# Patient Record
Sex: Male | Born: 1970 | Race: White | Hispanic: No | Marital: Single | Smoking: Current every day smoker
Health system: Southern US, Community
[De-identification: ages and names within clinical notes are randomized; demographics above are authoritative.]

## PROBLEM LIST (undated history)

## (undated) DIAGNOSIS — J439 Emphysema, unspecified: Secondary | ICD-10-CM

## (undated) DIAGNOSIS — F1910 Other psychoactive substance abuse, uncomplicated: Secondary | ICD-10-CM

## (undated) DIAGNOSIS — F1010 Alcohol abuse, uncomplicated: Secondary | ICD-10-CM

## (undated) DIAGNOSIS — C4491 Basal cell carcinoma of skin, unspecified: Secondary | ICD-10-CM

## (undated) DIAGNOSIS — F419 Anxiety disorder, unspecified: Secondary | ICD-10-CM

## (undated) DIAGNOSIS — J45909 Unspecified asthma, uncomplicated: Secondary | ICD-10-CM

## (undated) DIAGNOSIS — M1990 Unspecified osteoarthritis, unspecified site: Secondary | ICD-10-CM

## (undated) DIAGNOSIS — I10 Essential (primary) hypertension: Secondary | ICD-10-CM

## (undated) DIAGNOSIS — F32A Depression, unspecified: Secondary | ICD-10-CM

## (undated) HISTORY — DX: Basal cell carcinoma of skin, unspecified: C44.91

## (undated) HISTORY — PX: KNEE SURGERY: SHX244

## (undated) HISTORY — DX: Emphysema, unspecified: J43.9

## (undated) HISTORY — DX: Anxiety disorder, unspecified: F41.9

## (undated) HISTORY — DX: Depression, unspecified: F32.A

## (undated) HISTORY — DX: Other psychoactive substance abuse, uncomplicated: F19.10

## (undated) HISTORY — DX: Unspecified asthma, uncomplicated: J45.909

## (undated) HISTORY — DX: Unspecified osteoarthritis, unspecified site: M19.90

---

## 1998-05-20 ENCOUNTER — Emergency Department (HOSPITAL_COMMUNITY): Admission: EM | Admit: 1998-05-20 | Discharge: 1998-05-20 | Payer: Self-pay | Admitting: Emergency Medicine

## 1998-08-19 ENCOUNTER — Encounter: Payer: Self-pay | Admitting: Endocrinology

## 1998-08-19 ENCOUNTER — Emergency Department (HOSPITAL_COMMUNITY): Admission: EM | Admit: 1998-08-19 | Discharge: 1998-08-19 | Payer: Self-pay | Admitting: Endocrinology

## 1998-08-30 ENCOUNTER — Emergency Department (HOSPITAL_COMMUNITY): Admission: EM | Admit: 1998-08-30 | Discharge: 1998-08-30 | Payer: Self-pay | Admitting: Emergency Medicine

## 1999-02-14 ENCOUNTER — Emergency Department (HOSPITAL_COMMUNITY): Admission: EM | Admit: 1999-02-14 | Discharge: 1999-02-14 | Payer: Self-pay | Admitting: Emergency Medicine

## 1999-02-14 ENCOUNTER — Encounter: Payer: Self-pay | Admitting: Emergency Medicine

## 1999-11-20 ENCOUNTER — Encounter: Admission: RE | Admit: 1999-11-20 | Discharge: 1999-11-20 | Payer: Self-pay | Admitting: Internal Medicine

## 1999-12-25 ENCOUNTER — Encounter: Admission: RE | Admit: 1999-12-25 | Discharge: 1999-12-25 | Payer: Self-pay | Admitting: Internal Medicine

## 2000-02-25 ENCOUNTER — Encounter: Payer: Self-pay | Admitting: Emergency Medicine

## 2000-02-25 ENCOUNTER — Emergency Department (HOSPITAL_COMMUNITY): Admission: EM | Admit: 2000-02-25 | Discharge: 2000-02-25 | Payer: Self-pay | Admitting: Emergency Medicine

## 2000-03-04 ENCOUNTER — Emergency Department (HOSPITAL_COMMUNITY): Admission: EM | Admit: 2000-03-04 | Discharge: 2000-03-04 | Payer: Self-pay | Admitting: Emergency Medicine

## 2001-11-12 ENCOUNTER — Encounter: Admission: RE | Admit: 2001-11-12 | Discharge: 2001-11-12 | Payer: Self-pay | Admitting: Internal Medicine

## 2001-11-24 ENCOUNTER — Ambulatory Visit (HOSPITAL_COMMUNITY): Admission: RE | Admit: 2001-11-24 | Discharge: 2001-11-24 | Payer: Self-pay | Admitting: Internal Medicine

## 2001-11-24 ENCOUNTER — Encounter: Payer: Self-pay | Admitting: Internal Medicine

## 2002-05-10 ENCOUNTER — Encounter: Payer: Self-pay | Admitting: *Deleted

## 2002-05-10 ENCOUNTER — Emergency Department (HOSPITAL_COMMUNITY): Admission: EM | Admit: 2002-05-10 | Discharge: 2002-05-10 | Payer: Self-pay | Admitting: *Deleted

## 2003-07-15 ENCOUNTER — Emergency Department (HOSPITAL_COMMUNITY): Admission: EM | Admit: 2003-07-15 | Discharge: 2003-07-15 | Payer: Self-pay | Admitting: Emergency Medicine

## 2003-07-17 ENCOUNTER — Emergency Department (HOSPITAL_COMMUNITY): Admission: EM | Admit: 2003-07-17 | Discharge: 2003-07-17 | Payer: Self-pay | Admitting: Emergency Medicine

## 2006-12-23 ENCOUNTER — Emergency Department (HOSPITAL_COMMUNITY): Admission: EM | Admit: 2006-12-23 | Discharge: 2006-12-24 | Payer: Self-pay | Admitting: Emergency Medicine

## 2009-10-19 ENCOUNTER — Emergency Department (HOSPITAL_COMMUNITY): Admission: EM | Admit: 2009-10-19 | Discharge: 2009-10-20 | Payer: Self-pay | Admitting: Emergency Medicine

## 2010-07-23 LAB — DIFFERENTIAL
Basophils Relative: 2 % — ABNORMAL HIGH (ref 0–1)
Eosinophils Relative: 4 % (ref 0–5)
Lymphocytes Relative: 45 % (ref 12–46)
Monocytes Absolute: 0.8 10*3/uL (ref 0.1–1.0)
Monocytes Relative: 8 % (ref 3–12)
Neutro Abs: 3.9 10*3/uL (ref 1.7–7.7)

## 2010-07-23 LAB — CBC
HCT: 51.1 % (ref 39.0–52.0)
Platelets: 267 10*3/uL (ref 150–400)
RDW: 13.1 % (ref 11.5–15.5)
WBC: 9.5 10*3/uL (ref 4.0–10.5)

## 2010-07-23 LAB — COMPREHENSIVE METABOLIC PANEL
ALT: 22 U/L (ref 0–53)
AST: 25 U/L (ref 0–37)
Albumin: 3.8 g/dL (ref 3.5–5.2)
Alkaline Phosphatase: 169 U/L — ABNORMAL HIGH (ref 39–117)
BUN: 2 mg/dL — ABNORMAL LOW (ref 6–23)
CO2: 29 mEq/L (ref 19–32)
Calcium: 8.7 mg/dL (ref 8.4–10.5)
Chloride: 103 mEq/L (ref 96–112)
Creatinine, Ser: 0.91 mg/dL (ref 0.4–1.5)
GFR calc Af Amer: >60 mL/min (ref >60)
GFR calc non Af Amer: >60 mL/min (ref >60)
Glucose, Bld: 94 mg/dL (ref 70–99)
Potassium: 3.4 mEq/L — ABNORMAL LOW (ref 3.5–5.1)
Sodium: 139 mEq/L (ref 135–145)
Total Bilirubin: 0.5 mg/dL (ref 0.3–1.2)
Total Protein: 6.7 g/dL (ref 6.0–8.3)

## 2010-07-23 LAB — TRICYCLICS SCREEN, URINE: TCA Scrn: NOT DETECTED

## 2010-07-23 LAB — RAPID URINE DRUG SCREEN, HOSP PERFORMED
Amphetamines: NOT DETECTED
Benzodiazepines: NOT DETECTED
Tetrahydrocannabinol: NOT DETECTED

## 2010-07-23 LAB — ETHANOL: Alcohol, Ethyl (B): 320 mg/dL — ABNORMAL HIGH (ref 0–10)

## 2010-12-05 ENCOUNTER — Emergency Department (HOSPITAL_COMMUNITY)
Admission: EM | Admit: 2010-12-05 | Discharge: 2010-12-05 | Disposition: A | Discharge status: Home / Self Care | Payer: Self-pay | Attending: Emergency Medicine | Admitting: Emergency Medicine

## 2010-12-05 ENCOUNTER — Emergency Department (HOSPITAL_COMMUNITY): Payer: Self-pay

## 2010-12-05 DIAGNOSIS — M25569 Pain in unspecified knee: Secondary | ICD-10-CM | POA: Insufficient documentation

## 2010-12-05 DIAGNOSIS — X500XXA Overexertion from strenuous movement or load, initial encounter: Secondary | ICD-10-CM | POA: Insufficient documentation

## 2010-12-05 DIAGNOSIS — Y9373 Activity, racquet and hand sports: Secondary | ICD-10-CM | POA: Insufficient documentation

## 2010-12-05 DIAGNOSIS — IMO0002 Reserved for concepts with insufficient information to code with codable children: Secondary | ICD-10-CM | POA: Insufficient documentation

## 2010-12-18 ENCOUNTER — Ambulatory Visit (HOSPITAL_COMMUNITY)
Admission: RE | Admit: 2010-12-18 | Discharge: 2010-12-18 | Disposition: A | Discharge status: Home / Self Care | Payer: Self-pay | Source: Ambulatory Visit | Attending: Orthopedic Surgery | Admitting: Orthopedic Surgery

## 2010-12-18 ENCOUNTER — Encounter (HOSPITAL_COMMUNITY)
Admission: RE | Admit: 2010-12-18 | Discharge: 2010-12-18 | Disposition: A | Discharge status: Home / Self Care | Payer: Self-pay | Source: Ambulatory Visit | Attending: Orthopedic Surgery | Admitting: Orthopedic Surgery

## 2010-12-18 ENCOUNTER — Other Ambulatory Visit (HOSPITAL_COMMUNITY): Payer: Self-pay | Admitting: Orthopedic Surgery

## 2010-12-18 DIAGNOSIS — F17200 Nicotine dependence, unspecified, uncomplicated: Secondary | ICD-10-CM | POA: Insufficient documentation

## 2010-12-18 DIAGNOSIS — Z01812 Encounter for preprocedural laboratory examination: Secondary | ICD-10-CM | POA: Insufficient documentation

## 2010-12-18 DIAGNOSIS — S83242A Other tear of medial meniscus, current injury, left knee, initial encounter: Secondary | ICD-10-CM

## 2010-12-18 DIAGNOSIS — I10 Essential (primary) hypertension: Secondary | ICD-10-CM | POA: Insufficient documentation

## 2010-12-18 DIAGNOSIS — Z01818 Encounter for other preprocedural examination: Secondary | ICD-10-CM | POA: Insufficient documentation

## 2010-12-18 LAB — CBC
HCT: 43.5 % (ref 39.0–52.0)
Hemoglobin: 15.5 g/dL (ref 13.0–17.0)
MCH: 30.6 pg (ref 26.0–34.0)
MCHC: 35.6 g/dL (ref 30.0–36.0)
RDW: 13.2 % (ref 11.5–15.5)

## 2010-12-18 LAB — COMPREHENSIVE METABOLIC PANEL
Albumin: 3.6 g/dL (ref 3.5–5.2)
BUN: 8 mg/dL (ref 6–23)
Calcium: 9.6 mg/dL (ref 8.4–10.5)
Creatinine, Ser: 0.9 mg/dL (ref 0.50–1.35)
GFR calc Af Amer: >60 mL/min (ref >60)
Glucose, Bld: 102 mg/dL — ABNORMAL HIGH (ref 70–99)
Total Protein: 6.4 g/dL (ref 6.0–8.3)

## 2010-12-18 LAB — SURGICAL PCR SCREEN
MRSA, PCR: NEGATIVE
Staphylococcus aureus: NEGATIVE

## 2010-12-22 ENCOUNTER — Ambulatory Visit (HOSPITAL_COMMUNITY)
Admission: RE | Admit: 2010-12-22 | Discharge: 2010-12-22 | Disposition: A | Discharge status: Home / Self Care | Payer: Self-pay | Source: Ambulatory Visit | Attending: Orthopedic Surgery | Admitting: Orthopedic Surgery

## 2010-12-22 DIAGNOSIS — F17200 Nicotine dependence, unspecified, uncomplicated: Secondary | ICD-10-CM | POA: Insufficient documentation

## 2010-12-22 DIAGNOSIS — Z01812 Encounter for preprocedural laboratory examination: Secondary | ICD-10-CM | POA: Insufficient documentation

## 2010-12-22 DIAGNOSIS — Z01818 Encounter for other preprocedural examination: Secondary | ICD-10-CM | POA: Insufficient documentation

## 2010-12-22 DIAGNOSIS — Z01810 Encounter for preprocedural cardiovascular examination: Secondary | ICD-10-CM | POA: Insufficient documentation

## 2010-12-22 DIAGNOSIS — M23329 Other meniscus derangements, posterior horn of medial meniscus, unspecified knee: Secondary | ICD-10-CM | POA: Insufficient documentation

## 2010-12-22 DIAGNOSIS — M23205 Derangement of unspecified medial meniscus due to old tear or injury, unspecified knee: Secondary | ICD-10-CM | POA: Insufficient documentation

## 2010-12-22 DIAGNOSIS — M6750 Plica syndrome, unspecified knee: Secondary | ICD-10-CM | POA: Insufficient documentation

## 2011-01-11 NOTE — Op Note (Signed)
  Dwayne Cortez, Dwayne Cortez               ACCOUNT NO.:  [id number]  MEDICAL RECORD NO.:  [id number]  LOCATION:  SDSC                         FACILITY:  [facility]  PHYSICIAN:  Harvie Junior, M.D.   DATE OF BIRTH:  [birth year]  DATE OF PROCEDURE:  12/22/2010 DATE OF DISCHARGE:                              OPERATIVE REPORT   PREOPERATIVE NOTE:  Medial meniscal tear.  POSTOPERATIVE DIAGNOSES: 1. Medial meniscal tear. 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  PROCEDURE: 1. Partial posterior horn medial meniscectomy of bucket-handle medial     meniscal tear. 2. Debridement of chondromalacia patella. 3. Debridement of medial shelf plica.  SURGEON:  Harvie Junior, MD  ASSISTANT:  Cristine Polio, PA student.  ANESTHESIA:  General.  BRIEF HISTORY:  Dwayne Cortez is a 40 year old male with a history of having some knee pain who presented to the emergency room after a twisting episode with a locked knee.  We were consulted for his management and he was brought to the operating room for treatment of his locked knee.  PROCEDURE:  The patient was brought to the operating room.  After adequate anesthesia was obtained with general anesthetic, the patient was placed supine on the operating room table.  The left leg was then prepped and draped in usual sterile fashion.  Following this, a routine arthroscopic examination of the knee revealed there was an obvious bucket-handle medial meniscal tear.  This was released in the back, released in the front, removed in total.  The remaining meniscal rim was contoured down with a suction shaver.  Attention was then turned towards the ACL, which was normal, lateral side normal.  Patellofemoral joint showed some grade 2 chondromalacia, which was debrided with a suction shaver back to a smooth and stable rim.  There was large medial shelf plica draping entrance into the medial compartment was debrided to allow better access center of the medial  compartment.  At this point, the knee was copiously and thoroughly irrigated and suctioned dry, and all sterile portals were closed with bandage.  A sterile compressive dressing was applied and the patient was taken to recovery room and was noted to be in satisfactory condition.  Estimated blood loss procedure was none.     Harvie Junior, M.D.     Ranae Plumber  D:  12/22/2010  T:  12/22/2010  Job:  [id number]  Electronically Signed by Jodi Geralds M.D. on 01/11/2011 08:27:30 AM

## 2011-10-14 ENCOUNTER — Emergency Department (HOSPITAL_COMMUNITY)
Admission: EM | Admit: 2011-10-14 | Discharge: 2011-10-15 | Disposition: A | Discharge status: Rehabilitation Facility | Payer: Self-pay | Attending: Emergency Medicine | Admitting: Emergency Medicine

## 2011-10-14 ENCOUNTER — Encounter (HOSPITAL_COMMUNITY): Payer: Self-pay | Admitting: Emergency Medicine

## 2011-10-14 ENCOUNTER — Emergency Department (HOSPITAL_COMMUNITY)
Admission: EM | Admit: 2011-10-14 | Discharge: 2011-10-14 | Disposition: A | Discharge status: Home / Self Care | Payer: Self-pay

## 2011-10-14 DIAGNOSIS — F329 Major depressive disorder, single episode, unspecified: Secondary | ICD-10-CM | POA: Insufficient documentation

## 2011-10-14 DIAGNOSIS — R7401 Elevation of levels of liver transaminase levels: Secondary | ICD-10-CM | POA: Insufficient documentation

## 2011-10-14 DIAGNOSIS — R7402 Elevation of levels of lactic acid dehydrogenase (LDH): Secondary | ICD-10-CM | POA: Insufficient documentation

## 2011-10-14 DIAGNOSIS — I10 Essential (primary) hypertension: Secondary | ICD-10-CM | POA: Insufficient documentation

## 2011-10-14 DIAGNOSIS — F17200 Nicotine dependence, unspecified, uncomplicated: Secondary | ICD-10-CM | POA: Insufficient documentation

## 2011-10-14 DIAGNOSIS — F1410 Cocaine abuse, uncomplicated: Secondary | ICD-10-CM | POA: Insufficient documentation

## 2011-10-14 DIAGNOSIS — F1010 Alcohol abuse, uncomplicated: Secondary | ICD-10-CM | POA: Insufficient documentation

## 2011-10-14 DIAGNOSIS — F3289 Other specified depressive episodes: Secondary | ICD-10-CM | POA: Insufficient documentation

## 2011-10-14 HISTORY — DX: Essential (primary) hypertension: I10

## 2011-10-14 LAB — CBC
MCHC: 36.5 g/dL — ABNORMAL HIGH (ref 30.0–36.0)
Platelets: 235 10*3/uL (ref 150–400)
RDW: 13.3 % (ref 11.5–15.5)
WBC: 9.2 10*3/uL (ref 4.0–10.5)

## 2011-10-14 LAB — RAPID URINE DRUG SCREEN, HOSP PERFORMED
Amphetamines: NOT DETECTED
Barbiturates: NOT DETECTED
Benzodiazepines: NOT DETECTED
Cocaine: POSITIVE — AB

## 2011-10-14 LAB — DIFFERENTIAL
Basophils Absolute: 0.1 10*3/uL (ref 0.0–0.1)
Basophils Relative: 2 % — ABNORMAL HIGH (ref 0–1)
Lymphocytes Relative: 32 % (ref 12–46)
Monocytes Absolute: 0.9 10*3/uL (ref 0.1–1.0)
Neutro Abs: 4.8 10*3/uL (ref 1.7–7.7)
Neutrophils Relative %: 53 % (ref 43–77)

## 2011-10-14 LAB — COMPREHENSIVE METABOLIC PANEL
ALT: 437 U/L — ABNORMAL HIGH (ref 0–53)
AST: 514 U/L — ABNORMAL HIGH (ref 0–37)
Albumin: 3.8 g/dL (ref 3.5–5.2)
Chloride: 98 mEq/L (ref 96–112)
Creatinine, Ser: 0.8 mg/dL (ref 0.50–1.35)
Sodium: 137 mEq/L (ref 135–145)
Total Bilirubin: 0.4 mg/dL (ref 0.3–1.2)

## 2011-10-14 LAB — ACETAMINOPHEN LEVEL: Acetaminophen (Tylenol), Serum: <15 ug/mL (ref 10–30)

## 2011-10-14 LAB — SALICYLATE LEVEL: Salicylate Lvl: <2 mg/dL — ABNORMAL LOW (ref 2.8–20.0)

## 2011-10-14 MED ORDER — ONDANSETRON HCL 8 MG PO TABS: 4.0000 mg | ORAL_TABLET | Freq: Three times a day (TID) | ORAL | Status: DC | PRN

## 2011-10-14 MED ORDER — NICOTINE 21 MG/24HR TD PT24
21.0000 mg | MEDICATED_PATCH | Freq: Every day | TRANSDERMAL | Status: DC
  Administered 2011-10-14: 21 mg via TRANSDERMAL
  Filled 2011-10-14: qty 1

## 2011-10-14 MED ORDER — LORAZEPAM 1 MG PO TABS
1.0000 mg | ORAL_TABLET | Freq: Four times a day (QID) | ORAL | Status: DC | PRN
  Administered 2011-10-14 – 2011-10-15 (×2): 1 mg via ORAL
  Filled 2011-10-14: qty 1

## 2011-10-14 MED ORDER — VITAMIN B-1 100 MG PO TABS
100.0000 mg | ORAL_TABLET | Freq: Every day | ORAL | Status: DC
  Administered 2011-10-14: 100 mg via ORAL
  Filled 2011-10-14: qty 1

## 2011-10-14 MED ORDER — LORAZEPAM 2 MG/ML IJ SOLN: 1.0000 mg | Freq: Four times a day (QID) | INTRAMUSCULAR | Status: DC | PRN

## 2011-10-14 MED ORDER — LORAZEPAM 1 MG PO TABS
0.0000 mg | ORAL_TABLET | Freq: Four times a day (QID) | ORAL | Status: DC
  Administered 2011-10-14 (×2): 1 mg via ORAL
  Filled 2011-10-14: qty 1
  Filled 2011-10-14: qty 2
  Filled 2011-10-14: qty 1

## 2011-10-14 MED ORDER — FOLIC ACID 1 MG PO TABS
1.0000 mg | ORAL_TABLET | Freq: Every day | ORAL | Status: DC
  Administered 2011-10-14: 1 mg via ORAL
  Filled 2011-10-14: qty 1

## 2011-10-14 MED ORDER — LORAZEPAM 1 MG PO TABS: 0.0000 mg | ORAL_TABLET | Freq: Two times a day (BID) | ORAL | Status: DC

## 2011-10-14 MED ORDER — LORAZEPAM 1 MG PO TABS: 1.0000 mg | ORAL_TABLET | Freq: Three times a day (TID) | ORAL | Status: DC | PRN

## 2011-10-14 MED ORDER — THIAMINE HCL 100 MG/ML IJ SOLN: 100.0000 mg | Freq: Every day | INTRAMUSCULAR | Status: DC

## 2011-10-14 MED ORDER — ADULT MULTIVITAMIN W/MINERALS CH
1.0000 | ORAL_TABLET | Freq: Every day | ORAL | Status: DC
  Administered 2011-10-14: 1 via ORAL
  Filled 2011-10-14 (×2): qty 1

## 2011-10-14 NOTE — ED Notes (Signed)
Pt reports here for Detox. Pt had 2 drinks prior to arrival "to stop the tremors."

## 2011-10-14 NOTE — ED Notes (Signed)
After consulting ACT team member, pt. Moved to [facility] so he can have a sitter at bedside.
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
## 2011-10-14 NOTE — ED Notes (Signed)
Pt. Stated that he went to [facility] this morning for Detox but "couldn't take it" so he left. States he has tried detox in the past, last time 2 years ago. Reports his last drink being on his way here, had two [employer]ikes hard lemonades. Pt. Reports he also took 2 ativan this AM to stop tremors with no relief. Denies wanting to hurt himself or others. Friend at bedside.

## 2011-10-14 NOTE — ED Notes (Signed)
Meal tray ordered 

## 2011-10-14 NOTE — ED Provider Notes (Signed)
History     CSN: [id number]  Arrival date & time 10/14/11  1459   First MD Initiated Contact with Patient 10/14/11 1534      Chief Complaint  Patient presents with  . Medical Clearance    Detox     Patient is a 41 y.o. male presenting with drug/alcohol assessment. The history is provided by the patient.  Drug / Alcohol Assessment Primary symptoms include intoxication (pt states he drank 12-18 beers last night; 2 today).  Primary symptoms include no somnolence, no loss of consciousness, no seizures, no weakness, no agitation, no hallucinations and no self-injury. Primary symptoms comment: pt feels he "has the shakes" and "wants detox" This is a chronic problem. The current episode started more than 1 week ago. The problem has not changed since onset.Suspected agents include alcohol, cocaine and opiates. Pertinent negatives include no fever, no injury, no nausea and no vomiting. Associated medical issues include addiction treatment (pt was at detoxification program at [facility] approx 2 yrs ago) and withdrawal syndrome (pt believes he is withdrawing from alcohol; however, last drink just PTA and 12-18 beers last night).    Past Medical History  Diagnosis Date  . Hypertension     Past Surgical History  Procedure Date  . Knee surgery     History reviewed. No pertinent family history.  History  Substance Use Topics  . Smoking status: Current Everyday Smoker -- 1.5 packs/day  . Smokeless tobacco: Not on file  . Alcohol Use: Yes     "Drinks as much as I can get my hands on." 10-12 drinks a day      Review of Systems  Constitutional: Negative for fever and chills.  Respiratory: Negative for cough, chest tightness, shortness of breath and wheezing.   Cardiovascular: Negative for chest pain, palpitations and leg swelling.  Gastrointestinal: Negative for nausea, vomiting, abdominal pain, diarrhea and constipation.  Skin: Negative for rash and wound.  Neurological: Positive for tremors.  Negative for dizziness, seizures, loss of consciousness, syncope, weakness and light-headedness.  Psychiatric/Behavioral: Positive for decreased concentration. Negative for suicidal ideas, hallucinations, self-injury and agitation. The patient is nervous/anxious and is hyperactive.   All other systems reviewed and are negative.    Allergies  Review of patient's allergies indicates no known allergies.  Home Medications  No current outpatient prescriptions on file.  BP 147/92  Pulse 101  Temp(Src) 98.4 F (36.9 C) (Oral)  Resp 18  SpO2 97%  Physical Exam  Nursing note and vitals reviewed. Constitutional: He is oriented to person, place, and time. He appears well-developed and well-nourished.       Disheveled  HENT:  Head: Normocephalic and atraumatic.  Right Ear: External ear normal.  Left Ear: External ear normal.  Nose: Nose normal.  Mouth/Throat: Oropharynx is clear and moist. No oropharyngeal exudate.  Eyes: Conjunctivae and EOM are normal. Pupils are equal, round, and reactive to light.  Neck: Normal range of motion. Neck supple.  Cardiovascular: Normal rate, regular rhythm, normal heart sounds and intact distal pulses.   Pulmonary/Chest: Effort normal and breath sounds normal. No respiratory distress. He has no wheezes. He has no rales. He exhibits no tenderness.  Abdominal: Soft. Bowel sounds are normal. He exhibits no distension and no mass. There is no tenderness. There is no rebound and no guarding.  Musculoskeletal: Normal range of motion. He exhibits no edema and no tenderness.  Neurological: He is alert and oriented to person, place, and time.  Skin: Skin is warm and dry.  No rash noted. No erythema. No pallor.  Psychiatric:       Constricted affect    ED Course  Procedures (including critical care time)  Labs Reviewed  CBC - Abnormal; Notable for the following:    Hemoglobin 17.5 (*)    MCHC 36.5 (*)    All other components within normal limits    DIFFERENTIAL - Abnormal; Notable for the following:    Basophils Relative 2 (*)    All other components within normal limits  COMPREHENSIVE METABOLIC PANEL - Abnormal; Notable for the following:    Glucose, Bld 124 (*)    AST 514 (*) SLIGHT HEMOLYSIS   ALT 437 (*)    Alkaline Phosphatase 256 (*)    All other components within normal limits  ETHANOL - Abnormal; Notable for the following:    Alcohol, Ethyl (B) 259 (*)    All other components within normal limits  SALICYLATE LEVEL - Abnormal; Notable for the following:    Salicylate Lvl <2.0 (*)    All other components within normal limits  URINE RAPID DRUG SCREEN (HOSP PERFORMED) - Abnormal; Notable for the following:    Cocaine POSITIVE (*)    All other components within normal limits  ACETAMINOPHEN LEVEL   No results found.   1. Depression   2. Alcohol abuse   3. Cocaine abuse   4. Elevated transaminase level       MDM  41 yo M presents requesting detoxification from multiple substances, particularly alcohol. Not clinically intoxicated; however, pt states he drank 12-18 beers last night and had two beers today just PTA. Pt slightly tachycardic in triage (101 bpm); however, does not appear to be actively withdrawing. Psych panel ordered and c/w elevated transaminases; however, clinical picture not c/w acute hepatitis or  obstructive pathology; likely secondary to alcohol abuse. Pt also admits to depression but denies suicidal ideation. ACT team consulted and will evaluate in ED.        Clemetine Marker, MD 10/14/11 2243

## 2011-10-14 NOTE — ED Notes (Signed)
Pt. Sleeping in bed.

## 2011-10-15 ENCOUNTER — Inpatient Hospital Stay (HOSPITAL_COMMUNITY)
Admission: AD | Admit: 2011-10-15 | Discharge: 2011-10-17 | DRG: 885 | Disposition: A | Discharge status: Home / Self Care | Payer: Federal, State, Local not specified - Other | Source: Other Acute Inpatient Hospital | Attending: Psychiatry | Admitting: Psychiatry

## 2011-10-15 DIAGNOSIS — F1210 Cannabis abuse, uncomplicated: Secondary | ICD-10-CM | POA: Diagnosis present

## 2011-10-15 DIAGNOSIS — F1994 Other psychoactive substance use, unspecified with psychoactive substance-induced mood disorder: Secondary | ICD-10-CM

## 2011-10-15 DIAGNOSIS — F332 Major depressive disorder, recurrent severe without psychotic features: Principal | ICD-10-CM | POA: Diagnosis present

## 2011-10-15 DIAGNOSIS — F1410 Cocaine abuse, uncomplicated: Secondary | ICD-10-CM | POA: Diagnosis present

## 2011-10-15 DIAGNOSIS — F4010 Social phobia, unspecified: Secondary | ICD-10-CM | POA: Diagnosis present

## 2011-10-15 DIAGNOSIS — F10988 Alcohol use, unspecified with other alcohol-induced disorder: Secondary | ICD-10-CM | POA: Diagnosis present

## 2011-10-15 DIAGNOSIS — F17200 Nicotine dependence, unspecified, uncomplicated: Secondary | ICD-10-CM | POA: Diagnosis present

## 2011-10-15 DIAGNOSIS — F1010 Alcohol abuse, uncomplicated: Secondary | ICD-10-CM

## 2011-10-15 DIAGNOSIS — F1020 Alcohol dependence, uncomplicated: Secondary | ICD-10-CM | POA: Diagnosis present

## 2011-10-15 DIAGNOSIS — F411 Generalized anxiety disorder: Secondary | ICD-10-CM | POA: Diagnosis present

## 2011-10-15 DIAGNOSIS — I10 Essential (primary) hypertension: Secondary | ICD-10-CM | POA: Diagnosis present

## 2011-10-15 DIAGNOSIS — Z79899 Other long term (current) drug therapy: Secondary | ICD-10-CM

## 2011-10-15 MED ORDER — CARBAMAZEPINE 200 MG PO TABS
200.0000 mg | ORAL_TABLET | Freq: Three times a day (TID) | ORAL | Status: DC
  Administered 2011-10-15 – 2011-10-17 (×6): 200 mg via ORAL
  Filled 2011-10-15 (×10): qty 1

## 2011-10-15 MED ORDER — TRAZODONE HCL 50 MG PO TABS
50.0000 mg | ORAL_TABLET | Freq: Every evening | ORAL | Status: DC | PRN
  Administered 2011-10-15: 50 mg via ORAL
  Filled 2011-10-15: qty 1

## 2011-10-15 MED ORDER — CHLORDIAZEPOXIDE HCL 25 MG PO CAPS
25.0000 mg | ORAL_CAPSULE | Freq: Four times a day (QID) | ORAL | Status: DC | PRN
  Administered 2011-10-15: 25 mg via ORAL
  Filled 2011-10-15: qty 1

## 2011-10-15 MED ORDER — HYDROXYZINE HCL 25 MG PO TABS
25.0000 mg | ORAL_TABLET | Freq: Four times a day (QID) | ORAL | Status: DC | PRN
  Administered 2011-10-15: 25 mg via ORAL

## 2011-10-15 MED ORDER — CLONIDINE HCL 0.2 MG PO TABS
ORAL_TABLET | ORAL | Status: AC
  Filled 2011-10-15: qty 1

## 2011-10-15 MED ORDER — ALUM & MAG HYDROXIDE-SIMETH 200-200-20 MG/5ML PO SUSP: 30.0000 mL | ORAL | Status: DC | PRN

## 2011-10-15 MED ORDER — LOPERAMIDE HCL 2 MG PO CAPS: 2.0000 mg | ORAL_CAPSULE | ORAL | Status: DC | PRN

## 2011-10-15 MED ORDER — VITAMIN B-1 100 MG PO TABS
100.0000 mg | ORAL_TABLET | Freq: Every day | ORAL | Status: DC
  Administered 2011-10-16 – 2011-10-17 (×2): 100 mg via ORAL
  Filled 2011-10-15 (×2): qty 1
  Filled 2011-10-15: qty 14
  Filled 2011-10-15: qty 1

## 2011-10-15 MED ORDER — CLONIDINE HCL 0.1 MG PO TABS
0.2000 mg | ORAL_TABLET | Freq: Once | ORAL | Status: AC
  Administered 2011-10-15: 0.2 mg via ORAL

## 2011-10-15 MED ORDER — ONDANSETRON 4 MG PO TBDP: 4.0000 mg | ORAL_TABLET | Freq: Four times a day (QID) | ORAL | Status: DC | PRN

## 2011-10-15 MED ORDER — NICOTINE 21 MG/24HR TD PT24
21.0000 mg | MEDICATED_PATCH | Freq: Every day | TRANSDERMAL | Status: DC
  Administered 2011-10-15 – 2011-10-17 (×3): 21 mg via TRANSDERMAL
  Filled 2011-10-15: qty 1
  Filled 2011-10-15: qty 14
  Filled 2011-10-15 (×3): qty 1

## 2011-10-15 MED ORDER — MAGNESIUM HYDROXIDE 400 MG/5ML PO SUSP: 30.0000 mL | Freq: Every day | ORAL | Status: DC | PRN

## 2011-10-15 MED ORDER — LORAZEPAM 1 MG PO TABS
6.0000 mg | ORAL_TABLET | Freq: Once | ORAL | Status: AC
  Administered 2011-10-15: 6 mg via ORAL
  Filled 2011-10-15: qty 6

## 2011-10-15 MED ORDER — ADULT MULTIVITAMIN W/MINERALS CH
1.0000 | ORAL_TABLET | Freq: Every day | ORAL | Status: DC
  Administered 2011-10-15 – 2011-10-17 (×3): 1 via ORAL
  Filled 2011-10-15 (×5): qty 1

## 2011-10-15 MED ORDER — THIAMINE HCL 100 MG/ML IJ SOLN: 100.0000 mg | Freq: Once | INTRAMUSCULAR | Status: DC

## 2011-10-15 MED ORDER — ACETAMINOPHEN 325 MG PO TABS: 650.0000 mg | ORAL_TABLET | Freq: Four times a day (QID) | ORAL | Status: DC | PRN

## 2011-10-15 NOTE — Progress Notes (Signed)
[facility] MD Progress Note  10/15/2011 11:20 PM  Diagnosis:   Axis I: Alcohol Abuse and Substance Induced Mood Disorder Axis III:  Past Medical History  Diagnosis Date  . Hypertension     ADL's:  Intact  Sleep: Poor  Appetite:  Poor  Suicidal Ideation:  Pt is wanting to leave the hospital because he is anxious and does not like being around too many people.  He has depression and anxiety problems.  He is detoxing from alcohol.  Will use Tegretol for his alcohol detox. Homicidal Ideation:  Denies adamantly any homicidal thoughts.  Mental Status Examination/Evaluation: Objective:  Appearance: Disheveled  Eye Contact::  Fair  Speech:  Normal Rate  Volume:  Normal  Mood:  Anxious, Depressed, Hopeless and Irritable  Affect:  Blunt  Orientation:  Full  Thought Content:  WDL  Suicidal Thoughts:  No  Homicidal Thoughts:  No  Memory:  Immediate;   Fair  Judgement:  Impaired  Insight:  Lacking  Psychomotor Activity:  Normal  Concentration:  Fair  Recall:  Fair  Akathisia:  No  AIMS (if indicated):     Assets:  Communication Skills  Sleep:  Number of Hours: 0    Vital Signs:Blood pressure 155/110, pulse 101, temperature 98.1 F (36.7 C), temperature source Oral, resp. rate 17, height 6\' 1"  (1.854 m), weight 72.122 kg (159 lb). Current Medications: Current Facility-Administered Medications  Medication Dose Route Frequency Provider Last Rate Last Dose  . acetaminophen (TYLENOL) tablet 650 mg  650 mg Oral Q6H PRN Jorje Guild, PA-C      . alum & mag hydroxide-simeth (MAALOX/MYLANTA) 200-200-20 MG/5ML suspension 30 mL  30 mL Oral Q4H PRN Jorje Guild, PA-C      . carbamazepine (TEGRETOL) tablet 200 mg  200 mg Oral TID Mike Craze, MD   200 mg at 10/15/11 1721  . chlordiazePOXIDE (LIBRIUM) capsule 25 mg  25 mg Oral Q6H PRN Jorje Guild, PA-C   25 mg at 10/15/11 1118  . hydrOXYzine (ATARAX/VISTARIL) tablet 25 mg  25 mg Oral Q6H PRN Jorje Guild, PA-C   25 mg at 10/15/11 1118  . loperamide  (IMODIUM) capsule 2-4 mg  2-4 mg Oral PRN Jorje Guild, PA-C      . magnesium hydroxide (MILK OF MAGNESIA) suspension 30 mL  30 mL Oral Daily PRN Jorje Guild, PA-C      . multivitamin with minerals tablet 1 tablet  1 tablet Oral Daily Jorje Guild, PA-C   1 tablet at 10/15/11 [phone]  . nicotine (NICODERM CQ - dosed in mg/24 hours) patch 21 mg  21 mg Transdermal Daily Sanjuana Kava, NP   21 mg at 10/15/11 1721  . ondansetron (ZOFRAN-ODT) disintegrating tablet 4 mg  4 mg Oral Q6H PRN Jorje Guild, PA-C      . thiamine (B-1) injection 100 mg  100 mg Intramuscular Once Jorje Guild, PA-C      . thiamine (VITAMIN B-1) tablet 100 mg  100 mg Oral Daily Jorje Guild, PA-C      . traZODone (DESYREL) tablet 50 mg  50 mg Oral QHS PRN Jorje Guild, PA-C   50 mg at 10/15/11 2231   Facility-Administered Medications Ordered in Other Encounters  Medication Dose Route Frequency Provider Last Rate Last Dose  . cloNIDine (CATAPRES) tablet 0.2 mg  0.2 mg Oral Once Hilario Quarry, MD   0.2 mg at 10/15/11 0355  . LORazepam (ATIVAN) tablet 6 mg  6 mg Oral Once Hilario Quarry, MD  6 mg at 10/15/11 0335  . DISCONTD: folic acid (FOLVITE) tablet 1 mg  1 mg Oral Daily Clemetine Marker, MD   1 mg at 10/14/11 1631  . DISCONTD: LORazepam (ATIVAN) injection 1 mg  1 mg Intravenous Q6H PRN Clemetine Marker, MD      . DISCONTD: LORazepam (ATIVAN) tablet 0-4 mg  0-4 mg Oral Q6H Clemetine Marker, MD   1 mg at 10/14/11 2241  . DISCONTD: LORazepam (ATIVAN) tablet 0-4 mg  0-4 mg Oral Q12H Clemetine Marker, MD      . DISCONTD: LORazepam (ATIVAN) tablet 1 mg  1 mg Oral Q6H PRN Clemetine Marker, MD   1 mg at 10/15/11 0235  . DISCONTD: multivitamin with minerals tablet 1 tablet  1 tablet Oral Daily Clemetine Marker, MD   1 tablet at 10/14/11 1631  . DISCONTD: nicotine (NICODERM CQ - dosed in mg/24 hours) patch 21 mg  21 mg Transdermal Daily Clemetine Marker, MD   21 mg at 10/14/11 1633  . DISCONTD: ondansetron (ZOFRAN) tablet 4 mg  4 mg Oral Q8H PRN Clemetine Marker, MD      . DISCONTD: thiamine  (B-1) injection 100 mg  100 mg Intravenous Daily Clemetine Marker, MD      . DISCONTD: thiamine (VITAMIN B-1) tablet 100 mg  100 mg Oral Daily Clemetine Marker, MD   100 mg at 10/14/11 1631    Lab Results:  Results for orders placed during the hospital encounter of 10/14/11 (from the past 48 hour(s))  CBC     Status: Abnormal   Collection Time   10/14/11  3:53 PM      Component Value Range Comment   WBC 9.2  4.0 - 10.5 (K/uL)    RBC 5.19  4.22 - 5.81 (MIL/uL)    Hemoglobin 17.5 (*) 13.0 - 17.0 (g/dL)    HCT 16.1  09.6 - 04.5 (%)    MCV 92.3  78.0 - 100.0 (fL)    MCH 33.7  26.0 - 34.0 (pg)    MCHC 36.5 (*) 30.0 - 36.0 (g/dL)    RDW 40.9  81.1 - 91.4 (%)    Platelets 235  150 - 400 (K/uL)   DIFFERENTIAL     Status: Abnormal   Collection Time   10/14/11  3:53 PM      Component Value Range Comment   Neutrophils Relative 53  43 - 77 (%)    Neutro Abs 4.8  1.7 - 7.7 (K/uL)    Lymphocytes Relative 32  12 - 46 (%)    Lymphs Abs 3.0  0.7 - 4.0 (K/uL)    Monocytes Relative 10  3 - 12 (%)    Monocytes Absolute 0.9  0.1 - 1.0 (K/uL)    Eosinophils Relative 3  0 - 5 (%)    Eosinophils Absolute 0.3  0.0 - 0.7 (K/uL)    Basophils Relative 2 (*) 0 - 1 (%)    Basophils Absolute 0.1  0.0 - 0.1 (K/uL)   COMPREHENSIVE METABOLIC PANEL     Status: Abnormal   Collection Time   10/14/11  3:53 PM      Component Value Range Comment   Sodium 137  135 - 145 (mEq/L)    Potassium 3.9  3.5 - 5.1 (mEq/L)    Chloride 98  96 - 112 (mEq/L)    CO2 23  19 - 32 (mEq/L)    Glucose, Bld 124 (*) 70 - 99 (mg/dL)    BUN 6  6 - 23 (mg/dL)  Creatinine, Ser 0.80  0.50 - 1.35 (mg/dL)    Calcium 8.7  8.4 - 10.5 (mg/dL)    Total Protein 7.3  6.0 - 8.3 (g/dL)    Albumin 3.8  3.5 - 5.2 (g/dL)    AST 161 (*) 0 - 37 (U/L) SLIGHT HEMOLYSIS   ALT 437 (*) 0 - 53 (U/L)    Alkaline Phosphatase 256 (*) 39 - 117 (U/L)    Total Bilirubin 0.4  0.3 - 1.2 (mg/dL)    GFR calc non Af Amer >90  >90 (mL/min)    GFR calc Af Amer >90  >90 (mL/min)    ETHANOL     Status: Abnormal   Collection Time   10/14/11  3:53 PM      Component Value Range Comment   Alcohol, Ethyl (B) 259 (*) 0 - 11 (mg/dL)   ACETAMINOPHEN LEVEL     Status: Normal   Collection Time   10/14/11  3:53 PM      Component Value Range Comment   Acetaminophen (Tylenol), Serum <15.0  10 - 30 (ug/mL)   SALICYLATE LEVEL     Status: Abnormal   Collection Time   10/14/11  3:53 PM      Component Value Range Comment   Salicylate Lvl <2.0 (*) 2.8 - 20.0 (mg/dL)   URINE RAPID DRUG SCREEN (HOSP PERFORMED)     Status: Abnormal   Collection Time   10/14/11  5:20 PM      Component Value Range Comment   Opiates NONE DETECTED  NONE DETECTED     Cocaine POSITIVE (*) NONE DETECTED     Benzodiazepines NONE DETECTED  NONE DETECTED     Amphetamines NONE DETECTED  NONE DETECTED     Tetrahydrocannabinol NONE DETECTED  NONE DETECTED     Barbiturates NONE DETECTED  NONE DETECTED      Physical Findings: AIMS:  , ,  ,  ,    CIWA:  CIWA-Ar Total: 2  COWS:     Treatment Plan Summary: Daily contact with patient to assess and evaluate symptoms and progress in treatment Medication management No signs/symptoms of withdrawal and mood/anxiety less than 3/10 where 1 is the best and 10 is the worst.  Plan: Admit.  Detox with Tegretol to prevent withdrawal seizures, anxiety, and depression. Pt uncooperative with agreeing to consent to treat because of his addiction to alcohol.  Will detox and then re evaluate his motivation to seek further rehab treatment.  Damary Doland 10/15/2011, 11:20 PM

## 2011-10-15 NOTE — ED Provider Notes (Signed)
I saw and evaluated the patient, reviewed the resident's note and I agree with the findings and plan.  Requesting alcohol detox. Slight tachycardia but calm. Does not appear to be actively withdrawing. CIWA ordered.  Glynn Octave, MD 10/15/11 [phone]

## 2011-10-15 NOTE — H&P (Signed)
Medical/psychiatric screening examination/treatment/procedure(s) were performed by non-physician practitioner and as supervising physician I was immediately available for consultation/collaboration.  I have seen and examined this patient and agree the major elements of this evaluation.  

## 2011-10-15 NOTE — Progress Notes (Signed)
Patient seen during during d/c planning group and or treatment team.  Patient advised he wanted to discharge today.  He stated he felt caged up.  Patient rated depression at eight and anxiety at nine.  He denies SI/HI.  Patient was upset when advised by MD that he would not be discharged today.

## 2011-10-15 NOTE — Progress Notes (Signed)
patient upset today b/c of not being able to go home today, really upset w/MD and staff balling up his fists and jerking his arms up and down. Talked to him for a few minutes and offered him vistaril and Librium for anxiety, he accepted them and said "thank you". Denies Si or Hi. Eating meals in the DR, taking meds as ordered, was seen by the tx team and ordered Tegretol, this was begun at 12noon today. q20min safety checks continue and support offered Safety maintained
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
## 2011-10-15 NOTE — Progress Notes (Signed)
[facility] Group Notes:  (Counselor/Nursing/MHT/Case Management/Adjunct) 10/15/2011  11:00am Overcoming Obstacles to Wellness   Type of Therapy:  Group Therapy  Participation Level:  Did Not Attend     Josephine Rudnick Taree Hayes 10/15/2011   3:55 PM         [facility] Group Notes:  (Counselor/Nursing/MHT/Case Management/Adjunct) 10/15/2011  1:15pm Breathing & Meditating for Anxiety &Anger   Type of Therapy:  Group Therapy  Participation Level:  Did Not Attend     Herta Hink Taree Hayes 10/15/2011   3:55 PM                

## 2011-10-15 NOTE — Tx Team (Signed)
Initial Interdisciplinary Treatment Plan  PATIENT STRENGTHS: (choose at least two) Average or above average intelligence General fund of knowledge  PATIENT STRESSORS: Lose of job Financial difficulties    PROBLEM LIST: Problem List/Patient Goals Date to be addressed Date deferred Reason deferred Estimated date of resolution  Depressed with suicidal ideation 10/15/11     Substance Abuse 10/15/11                                                DISCHARGE CRITERIA:  Ability to meet basic life and health needs Adequate post-discharge living arrangements Improved stabilization in mood, thinking, and/or behavior Reduction of life-threatening or endangering symptoms to within safe limits Verbal commitment to aftercare and medication compliance Withdrawal symptoms are absent or subacute and managed without 24-hour nursing intervention  PRELIMINARY DISCHARGE PLAN: Attend 12-step recovery group Outpatient therapy Placement in alternative living arrangements  PATIENT/FAMIILY INVOLVEMENT: This treatment plan has been presented to and reviewed with the patient, Dwayne Cortez, Dwayne Cortez 10/15/2011, 5:38 AM

## 2011-10-15 NOTE — Tx Team (Addendum)
Interdisciplinary Treatment Plan Update (Adult)  Date:  10/15/2011  Time Reviewed:  10:52 AM   Progress in Treatment: Attending groups:   Yes   Participating in groups:  Yes Taking medication as prescribed:  Yes Tolerating medication:  Yes Family/Significant othe contact made: Counselor to assess for family contact Patient understands diagnosis:  Yes Discussing patient identified problems/goals with staff: Yes Medical problems stabilized or resolved: Yes Denies suicidal/homicidal ideation:Yes Issues/concerns per patient self-inventory:  Other:  New problem(s) identified:  Reason for Continuation of Hospitalization: Anxiety Depression Medication stabilization  Interventions implemented related to continuation of hospitalization:  Medication Management; safety checks q 15 mins  Additional comments:  Estimated length of stay: 1-2 days  Discharge Plan: To be determined  New goal(s):  Review of initial/current patient goals per problem list:    1.  Goal(s): Eliminate SI/other thoughts of self harm   Met:  Yes  Target date: d/c  As evidenced by: Patient no longer endorsing SI/Other thoughts of self harm  2.  Goal (s):  Reduce depression/anxiety (currently rated at eight and nine)   Met:  No  Target date: d/c  As evidenced by: Patient will rate symptoms at four or below  3.  Goal(s): .stabilize on meds   Met:  No  Target date: d/c  As evidenced by: Patient will report being stable on medications - symptoms have decreased  4.  Goal(s): Refer for outpatient follow up   Met:  No  Target date: d/c  As evidenced by:  Referral to be made for outpatient follow up  Attendees: Patient:  Dwayne Cortez 10/15/2011 10:52AM  Other: Carney Living, RN   10/15/2011 10:52AM  Physician:  Orson Aloe, MD 10/15/2011 10:52 AM   Nursing:   Alease Frame, RN 10/15/2011 10:52 AM   CaseManager:  Juline Patch, LCSW 10/15/2011 10:52 AM   Counselor:  Angus Palms, LCSW  10/15/2011 10:52 AM   Other:  Reyes Ivan, LCSWA

## 2011-10-15 NOTE — Progress Notes (Signed)
Patient ID: Dwayne Cortez, male   DOB: 1970/08/22, 41 y.o.   MRN: [id number] This is a 41 y.o. S/W/M vol. admission with a Dx of Mood D/O and Substance Abuse. The patient states he drinks about 12 or more beers/day and occasionally uses cocaine. States he had been in rehab about a year ago and stayed sober for about 6 months. Relapsed after losing his job and using prescription pain pills after knee surgery. Considers himself homeless, drifting between family and friends. Reports he has been having suicidal thoughts because he feels hopeless and helpless. Came to the hospital today hoping to get sober.

## 2011-10-15 NOTE — Progress Notes (Signed)
In bed, resting quietly with his eyes closed on approach. Opened eyes spontaneously to name. Appears blunted and depressed. Calm and cooperative with assessment. No acute distress noted. States he had a bad day r/t wanting to go home and being told he couldn't. Support and encouragement provided. Denies any WD symptoms. States he has been through detox before and knows the signs of WD. Didi request something for sleep. PRN provided. Otherwise offered no questions or concerns. Denies SI/HI/AVH and contracts for safety. Denies pain. POC and medications for the shift reviewed and understanding verbalized. Safety has been maintained with Q15 minute observation. Will continue current POC

## 2011-10-15 NOTE — ED Provider Notes (Signed)
Patient continues to be hypertensive at 167/110. RN reports that patient can go to behavioral health but they will not except the patient until the diastolic blood pressure is less than 100. He has been on Prevacid Ativan protocol and does not appear to be in withdrawal except for the elevated blood pressure. Upon further evaluation he does have a history of hypertension but has not been taking any medication for this he is given an additional 4 mg of Ativan 30 minutes prior to his usual dose of 4 mg of Ativan per withdrawal protocol. He is given 0.2 of clonidine. His blood pressure will be reassessed and he will be transferred to behavioral health since their parameters are met.  Hilario Quarry, MD 10/15/11 [phone]

## 2011-10-16 DIAGNOSIS — F1410 Cocaine abuse, uncomplicated: Secondary | ICD-10-CM

## 2011-10-16 DIAGNOSIS — F1010 Alcohol abuse, uncomplicated: Secondary | ICD-10-CM

## 2011-10-16 MED ORDER — HYDROXYZINE HCL 25 MG PO TABS: 50.0000 mg | ORAL_TABLET | Freq: Four times a day (QID) | ORAL | Status: AC | PRN

## 2011-10-16 MED ORDER — CARBAMAZEPINE 200 MG PO TABS: 200.0000 mg | ORAL_TABLET | Freq: Three times a day (TID) | ORAL | Status: DC

## 2011-10-16 MED ORDER — THIAMINE HCL 100 MG PO TABS: 100.0000 mg | ORAL_TABLET | Freq: Every day | ORAL | Status: DC

## 2011-10-16 MED ORDER — NICOTINE 21 MG/24HR TD PT24: 1.0000 | MEDICATED_PATCH | Freq: Every day | TRANSDERMAL | Status: AC

## 2011-10-16 NOTE — Progress Notes (Addendum)
Pt denies SI/HI/AVH. Pt states that he slept poor and his appetite is good. Pt states that his energy level is normal and his ability to pay attention is improving. Pt rates his depression as 8. He states that he has been experiencing cravings and agitation. He states that when he gets home he will follow through with medications management, meetings, treatment, and possibly therapy. Informed pt that MD wrote for him to attend NA/AA meetings on [address]. Pt states that he may have problems financially that would hinder him from being able to comply with medication management. Support and encouragement offered. Pt receptive.

## 2011-10-16 NOTE — Progress Notes (Addendum)
[facility] MD Progress Note  10/16/2011 1:22 PM  Diagnosis:   Axis I: Alcohol Abuse and Substance Induced Mood Disorder Axis III:  Past Medical History  Diagnosis Date  . Hypertension     ADL's:  Intact  Sleep: Poor, had bad horrible vivid dreams from the Trazodone  Appetite:  Poor  Suicidal Ideation:  Pt is wanting to leave the hospital because he is anxious and does not like being around too many people.  He has depression and anxiety problems.  He is detoxing from alcohol.  Will continue to use Tegretol for his alcohol detox. Homicidal Ideation:  Denies adamantly any homicidal thoughts.  Mental Status Examination/Evaluation: Objective:  Appearance: Disheveled  Eye Contact::  Fair  Speech:  Normal Rate  Volume:  Normal  Mood:  Anxious, Depressed and Hopeless  Affect:  Blunt  Orientation:  Full  Thought Content:  WDL  Suicidal Thoughts:  No  Homicidal Thoughts:  No  Memory:  Immediate;   Fair  Judgement:  Impaired  Insight:  Lacking  Psychomotor Activity:  Normal  Concentration:  Fair  Recall:  Fair  Akathisia:  No  AIMS (if indicated):     Assets:  Communication Skills Desire for Improvement  Sleep:  Number of Hours: 6.75    ROS: Neuro: some dizziness upon rising. no headaches, ataxia, weakness  GI: no N/V/D/cramps/constipation.  MS: no weakness, muscle cramps, aches.  Vital Signs:Blood pressure 158/111, pulse 90, temperature 98.1 F (36.7 C), temperature source Oral, resp. rate 18, height 6\' 1"  (1.854 m), weight 72.122 kg (159 lb). Current Medications: Current Facility-Administered Medications  Medication Dose Route Frequency Provider Last Rate Last Dose  . acetaminophen (TYLENOL) tablet 650 mg  650 mg Oral Q6H PRN Jorje Guild, PA-C      . alum & mag hydroxide-simeth (MAALOX/MYLANTA) 200-200-20 MG/5ML suspension 30 mL  30 mL Oral Q4H PRN Jorje Guild, PA-C      . carbamazepine (TEGRETOL) tablet 200 mg  200 mg Oral TID Mike Craze, MD   200 mg at 10/16/11 1151  .  chlordiazePOXIDE (LIBRIUM) capsule 25 mg  25 mg Oral Q6H PRN Jorje Guild, PA-C   25 mg at 10/15/11 1118  . hydrOXYzine (ATARAX/VISTARIL) tablet 25 mg  25 mg Oral Q6H PRN Jorje Guild, PA-C   25 mg at 10/15/11 1118  . loperamide (IMODIUM) capsule 2-4 mg  2-4 mg Oral PRN Jorje Guild, PA-C      . magnesium hydroxide (MILK OF MAGNESIA) suspension 30 mL  30 mL Oral Daily PRN Jorje Guild, PA-C      . multivitamin with minerals tablet 1 tablet  1 tablet Oral Daily Jorje Guild, PA-C   1 tablet at 10/16/11 0803  . nicotine (NICODERM CQ - dosed in mg/24 hours) patch 21 mg  21 mg Transdermal Daily Sanjuana Kava, NP   21 mg at 10/16/11 0641  . ondansetron (ZOFRAN-ODT) disintegrating tablet 4 mg  4 mg Oral Q6H PRN Jorje Guild, PA-C      . thiamine (B-1) injection 100 mg  100 mg Intramuscular Once Jorje Guild, PA-C      . thiamine (VITAMIN B-1) tablet 100 mg  100 mg Oral Daily Jorje Guild, PA-C   100 mg at 10/16/11 0803  . traZODone (DESYREL) tablet 50 mg  50 mg Oral QHS PRN Jorje Guild, PA-C   50 mg at 10/15/11 2231    Lab Results:  Results for orders placed during the hospital encounter of 10/14/11 (from the past 48 hour(s))  CBC     Status: Abnormal   Collection Time   10/14/11  3:53 PM      Component Value Range Comment   WBC 9.2  4.0 - 10.5 (K/uL)    RBC 5.19  4.22 - 5.81 (MIL/uL)    Hemoglobin 17.5 (*) 13.0 - 17.0 (g/dL)    HCT 62.9  52.8 - 41.3 (%)    MCV 92.3  78.0 - 100.0 (fL)    MCH 33.7  26.0 - 34.0 (pg)    MCHC 36.5 (*) 30.0 - 36.0 (g/dL)    RDW 24.4  01.0 - 27.2 (%)    Platelets 235  150 - 400 (K/uL)   DIFFERENTIAL     Status: Abnormal   Collection Time   10/14/11  3:53 PM      Component Value Range Comment   Neutrophils Relative 53  43 - 77 (%)    Neutro Abs 4.8  1.7 - 7.7 (K/uL)    Lymphocytes Relative 32  12 - 46 (%)    Lymphs Abs 3.0  0.7 - 4.0 (K/uL)    Monocytes Relative 10  3 - 12 (%)    Monocytes Absolute 0.9  0.1 - 1.0 (K/uL)    Eosinophils Relative 3  0 - 5 (%)    Eosinophils Absolute 0.3   0.0 - 0.7 (K/uL)    Basophils Relative 2 (*) 0 - 1 (%)    Basophils Absolute 0.1  0.0 - 0.1 (K/uL)   COMPREHENSIVE METABOLIC PANEL     Status: Abnormal   Collection Time   10/14/11  3:53 PM      Component Value Range Comment   Sodium 137  135 - 145 (mEq/L)    Potassium 3.9  3.5 - 5.1 (mEq/L)    Chloride 98  96 - 112 (mEq/L)    CO2 23  19 - 32 (mEq/L)    Glucose, Bld 124 (*) 70 - 99 (mg/dL)    BUN 6  6 - 23 (mg/dL)    Creatinine, Ser 5.36  0.50 - 1.35 (mg/dL)    Calcium 8.7  8.4 - 10.5 (mg/dL)    Total Protein 7.3  6.0 - 8.3 (g/dL)    Albumin 3.8  3.5 - 5.2 (g/dL)    AST 644 (*) 0 - 37 (U/L) SLIGHT HEMOLYSIS   ALT 437 (*) 0 - 53 (U/L)    Alkaline Phosphatase 256 (*) 39 - 117 (U/L)    Total Bilirubin 0.4  0.3 - 1.2 (mg/dL)    GFR calc non Af Amer >90  >90 (mL/min)    GFR calc Af Amer >90  >90 (mL/min)   ETHANOL     Status: Abnormal   Collection Time   10/14/11  3:53 PM      Component Value Range Comment   Alcohol, Ethyl (B) 259 (*) 0 - 11 (mg/dL)   ACETAMINOPHEN LEVEL     Status: Normal   Collection Time   10/14/11  3:53 PM      Component Value Range Comment   Acetaminophen (Tylenol), Serum <15.0  10 - 30 (ug/mL)   SALICYLATE LEVEL     Status: Abnormal   Collection Time   10/14/11  3:53 PM      Component Value Range Comment   Salicylate Lvl <2.0 (*) 2.8 - 20.0 (mg/dL)   URINE RAPID DRUG SCREEN (HOSP PERFORMED)     Status: Abnormal   Collection Time   10/14/11  5:20 PM      Component Value  Range Comment   Opiates NONE DETECTED  NONE DETECTED     Cocaine POSITIVE (*) NONE DETECTED     Benzodiazepines NONE DETECTED  NONE DETECTED     Amphetamines NONE DETECTED  NONE DETECTED     Tetrahydrocannabinol NONE DETECTED  NONE DETECTED     Barbiturates NONE DETECTED  NONE DETECTED      Physical Findings: AIMS:  , ,  ,  ,    CIWA:  CIWA-Ar Total: 0  COWS:     Treatment Plan Summary: Daily contact with patient to assess and evaluate symptoms and progress in treatment Medication  management No signs/symptoms of withdrawal and mood/anxiety less than 3/10 where 1 is the best and 10 is the worst.  Plan: Will get a Teg level in the AM.  He is too anxious being in the hospital and desires to leave. Will plan on that tomorrow.  Dwayne Cortez 10/16/2011, 1:22 PM

## 2011-10-16 NOTE — Progress Notes (Signed)
Currently resting quietly in bed with eyes closed. Respirations are even and unlabored. No acute distress noted. Safety has been maintained with Q15 minute observation. Will continue current POC. 

## 2011-10-16 NOTE — Progress Notes (Signed)
[facility] Adult Inpatient Family/Significant Other Suicide Prevention Education  Suicide Prevention Education:  Patient Refusal for Family/Significant Other Suicide Prevention Education: The patient Dwayne Cortez has refused to provide written consent for family/significant other to be provided Family/Significant Other Suicide Prevention Education during admission and/or prior to discharge.  Physician notified.  Andrae reported that there is no one in his life that he wants contacted by counselor. Counselor gave [employer] suicide prevention pamphlet and reviewed information therein with him.   [name] 10/16/2011, 3:57 PM
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
## 2011-10-16 NOTE — Progress Notes (Signed)
[facility] Group Notes: (Counselor/Nursing/MHT/Case Management/Adjunct) 10/16/2011   @ 11:00 Feelings About Diagnosis  Type of Therapy:  Group Therapy  Participation Level:  none  Participation Quality:  Inattentive  Affect:  Agitated, Angry  Cognitive:  Appropriate  Insight:  None  Engagement in Group: None  Engagement in Therapy:  None  Modes of Intervention:  Support and Exploration  Summary of Progress/Problems: Dwayne Cortez was not engaged in group, sat and sighed through most of group, or walked around to get water, napkins, etc. He originally told counselor he would not be coming to group, but came anyway.   [name] 10/16/2011  1:08 PM

## 2011-10-17 DIAGNOSIS — F332 Major depressive disorder, recurrent severe without psychotic features: Principal | ICD-10-CM

## 2011-10-17 DIAGNOSIS — F411 Generalized anxiety disorder: Secondary | ICD-10-CM

## 2011-10-17 DIAGNOSIS — F4010 Social phobia, unspecified: Secondary | ICD-10-CM

## 2011-10-17 MED ORDER — GABAPENTIN 300 MG PO CAPS: 300.0000 mg | ORAL_CAPSULE | Freq: Four times a day (QID) | ORAL | Status: DC

## 2011-10-17 MED ORDER — FLUOXETINE HCL 10 MG PO CAPS
10.0000 mg | ORAL_CAPSULE | Freq: Every day | ORAL | Status: DC
  Filled 2011-10-17 (×2): qty 14

## 2011-10-17 MED ORDER — GABAPENTIN 300 MG PO CAPS
300.0000 mg | ORAL_CAPSULE | Freq: Four times a day (QID) | ORAL | Status: DC
  Administered 2011-10-17: 300 mg via ORAL
  Filled 2011-10-17: qty 1
  Filled 2011-10-17: qty 42
  Filled 2011-10-17 (×2): qty 1
  Filled 2011-10-17: qty 42
  Filled 2011-10-17: qty 1
  Filled 2011-10-17 (×2): qty 42

## 2011-10-17 MED ORDER — FLUOXETINE HCL 10 MG PO CAPS
10.0000 mg | ORAL_CAPSULE | Freq: Every day | ORAL | Status: DC
  Administered 2011-10-17: 10 mg via ORAL
  Filled 2011-10-17: qty 21
  Filled 2011-10-17: qty 1

## 2011-10-17 MED ORDER — FLUOXETINE HCL 10 MG PO CAPS: ORAL_CAPSULE | ORAL | Status: DC

## 2011-10-17 MED ORDER — TRAZODONE HCL 50 MG PO TABS
50.0000 mg | ORAL_TABLET | Freq: Every evening | ORAL | Status: DC | PRN
  Filled 2011-10-17 (×2): qty 14

## 2011-10-17 MED ORDER — HYDROXYZINE HCL 50 MG PO TABS
50.0000 mg | ORAL_TABLET | Freq: Four times a day (QID) | ORAL | Status: DC | PRN
  Filled 2011-10-17: qty 30

## 2011-10-17 MED ORDER — GABAPENTIN 100 MG PO CAPS
100.0000 mg | ORAL_CAPSULE | ORAL | Status: AC
Start: 2011-10-17 — End: 2011-10-17
  Administered 2011-10-17: 100 mg via ORAL
  Filled 2011-10-17 (×2): qty 1

## 2011-10-17 NOTE — Discharge Summary (Signed)
Physician Discharge Summary Note  Patient:  Dwayne Cortez is an 41 y.o., male MRN:  [id number] DOB:  05-23-1970 Patient phone:  [phone] (home)  Patient address:   [address] [place] [address],   Date of Admission:  10/15/2011 Date of Discharge: 10/18/11  Reason for Admission: Suicidal ideations and alcohol detox.  Discharge Diagnoses: Principal Problem:  *Psychoactive substance-induced organic mood disorder Active Problems:  Alcohol abuse, continuous  Cocaine abuse  Generalized anxiety disorder  Social phobia   Axis Diagnosis:   AXIS I:  Generalized Anxiety Disorder and Psychoactive substance-induced organic mood disorder, Social phobia, Alcohol abuse continuous AXIS II:  Deferred AXIS III:   Past Medical History  Diagnosis Date  . Hypertension    AXIS IV:  economic problems, housing problems, occupational problems, other psychosocial or environmental problems and Substance abuse issues AXIS V:  68  Level of Care:  OP  Hospital Course:  This is a 41 year old Caucasian male, admitted to [facility] from the [facility] with reports of excessive use of alcohol and suicidal thoughts requesting detoxification. Patient reports, "I have been drinking alcohol heavily x 6 months. I drink about 12 - 16 bottles of beer daily x 6 Months. Sometimes, I feel so paranoid that I will seclude myself from the rest of the world and stay indoors and drink alcohol. I was in alcohol rehabilitation treatment about 2 years ago at [facility] and it helped me stay off of alcohol for a long time. I was sober x 1 year. I relapsed because of my depression and unemployment. This is my fist psychiatric hospital admission and I don't like it here. I am agitated right now. I can't smoke cigarettes. I need to go home. I would want to be discharged. I drink alcohol because it is comforting initially, then I will seclude myself from everyone else. This is why I'm having the suicidal thoughts. I was not  planning on carrying it out. I am facing homelessness right now. I don't have a place of my own. That is why I went to my mother's home. She lives way out there in [place]. There is nothing to do there to sustain any kind of living".  While a patient in this hospital, Mr. Earnshaw received medication management as well as enrolled in group counseling sessions and activities. He was ordered and received Fluoxetine 10 mg daily for depressive symptoms, gabapentin 300 mg for anxiety/adjunct for pain management, hydroxyzine 25 mg for anxiety and Nicotine patch to combat nicotine withdrawal symptoms. Patient also participated actively in group counseling sessions and activities as tolerated. He tolerated his treatment regimen without significant adverse effects and or reactions.  Patient on daily basis reported improved mood and decreased symptoms. He attended treatment team meeting this am and met with the team members. His treatment regimen, symptoms and response to treatment discussed. Patient endorsed that he is doing well and stable for discharge. He will continue psychiatric care on outpatient basis at [facility]. Patient is explained that this appointment with Vesta Mixer is a walk-in appointment between the hours of 08:00 am and 03:00 pm. The address, date and time for this appointment provided for patient. Patient also received 2 weeks worth samples of his [facility] discharge medications.   Upon discharge, Mr. Helle adamantly denies suicidal, homicidal ideations, auditory, visual hallucinations and or delusional thinking. He left [facility] with all personal belongings in no apparent distress via personal arranged transportation.   Consults:  None  Significant Diagnostic Studies:  labs: CMP, CBC with diff, Tegretol levels  Discharge Vitals:   Blood pressure 143/99, pulse 92, temperature 97.9 F (36.6 C), temperature source Oral, resp. rate 18, height 6\' 1"  (1.854 m), weight 72.122 kg (159 lb).  Mental Status  Exam: See Mental Status Examination and Suicide Risk Assessment completed by Attending Physician prior to discharge.  Discharge destination:  Home  Is patient on multiple antipsychotic therapies at discharge:  No   Has Patient had three or more failed trials of antipsychotic monotherapy by history:  No  Recommended Plan for Multiple Antipsychotic Therapies: Na  Discharge Orders    Future Orders Please Complete By Expires   Diet - low sodium heart healthy      Increase activity slowly        Medication List  As of 10/18/2011  5:41 PM   TAKE these medications      Indication    FLUoxetine 10 MG capsule   Commonly known as: PROZAC   Take by mouth one every evening for 1 week, then 2 every evening for another week, then 3 every evening for another week, then 4 every evening ongoing. For anxiety, depression, and OCD (takes 90 days to fully work on OCD)       gabapentin 300 MG capsule   Commonly known as: NEURONTIN   Take 1 capsule (300 mg total) by mouth 4 (four) times daily. For anxiety       hydrOXYzine 25 MG tablet   Commonly known as: ATARAX/VISTARIL   Take 2 tablets (50 mg total) by mouth every 6 (six) hours as needed for anxiety (Insomnia).       nicotine 21 mg/24hr patch   Commonly known as: NICODERM CQ - dosed in mg/24 hours   Place 1 patch onto the skin daily. For smoking cessation.       thiamine 100 MG tablet   Take 1 tablet (100 mg total) by mouth daily. For nutritional supplementation from stopping the consumption of alcohol.            Follow-up Information    Follow up with [facility]. (Please go to [facility] walk in Clinic on Thursday, October 18, 2011 between 8AM -3:00 PM)    Contact information:   [address] [place], [place]  [address]  [phone]         Follow-up recommendations:  Activity:  as tolerated Other:  Keep all scheduled follow-up appointments.  Comments:  Take all your medications as prescribed. Report to your outpatient provider any  adverse effects and or reactions from your medications promptly. Instructed patient to abstain from alcohol and or illegal drug use while on these prescription medicines. In the event of worsening symptoms, patient is instructed to call the crisis hotline, 911 and or go to the nearest ED.   SignedArmandina Stammer I 10/18/2011, 5:41 PM

## 2011-10-17 NOTE — Progress Notes (Signed)
Patient is rating depression and hopelessness at a 1/10.  He is participating in groups and is interacting with staff and other patients.  He is anxious to be discharged.

## 2011-10-17 NOTE — Discharge Instructions (Signed)
Attend 90 meetings in 90 days. Get trusted sponsor from the advise of others or from whomever in meetings seems to make sense, has a proven track record, will hold you responsible for your sobriety, and both expects and insists on total abstinence.  Work the steps HONESTLY with the trusted sponsor. Get obsessed with your recovery by often reminding yourself of how DEADLY this dredged horrible disease of addiction JUST IS. Focus the first month on speaker meetings where you specifically look at how your life has been wrecked by drugs/alcohol and how your life has been similar to that of the speakers.  DOUBLE WINNER!! Strongly consider attending at least 6 Alanon Meetings to help you learn about how your helping others to the exclusion of helping yourself is actually hurting yourself and is actually an addiction to fixing others and that you need to work the 12 Step to Happiness through the [employer]. Al-Anon Family Groups could be helpful with how to deal with substance abusing family and friends. Or your own issues of being in victim role.  There are only 40 Alanon Family Group meetings a week here in [place].  Online are current listing of those meetings @ [email or web address]  There are [employer].  Search on line and there you can learn the format and can access the schedule for yourself.  They ask you to temproarily block call waiting by starting with *70 then their number is [phone]  For what is believed to be alcohol effect on the brain, it advised that you get  regular exercise, regular sleep, and  consume good quality, fish oil, 1000 mg twice a day. These 3 things are the foundation of rehabilitating your brain. If memory is a problem then INSTEAD of the fish oil mentioned above, try using Brain Power Basics from [employer].  You can order online or by phone [phone]. It costs $99 for the first month, and $80 monthly thereafter, but that  investment in your brain and the recovery of your brain proper functioning would seem worth it.  SAMe 200mg  at night might help with getting to sleep or pushing to 400mg  at night might help with getting to sleep.  It also may help with managing stress, anxiety, or depression.

## 2011-10-17 NOTE — Progress Notes (Signed)
[facility] Case Management Discharge Plan:  Will you be returning to the same living situation after discharge: No.  Patient to stay with mother At discharge, do you have transportation home?:Yes,  [facility] has trannsportation home Do you have the ability to pay for your medications:No.  Will assist with indigent medications  Interagency Information:     Release of information consent forms completed and in the chart;  Patient's signature needed at discharge.  Patient to Follow up at:  Follow-up Information    Follow up with [facility]. (Please go to [facility] walk in Clinic on Thursday, October 18, 2011 between 8AM -3:00 PM)    Contact information:   [address] [place], [place]  [address]  [phone]         Patient denies SI/HI:   Yes,  Patient is not endorsing SI    Safety Planning and Suicide Prevention discussed:  Yes,  Reviewed during aftercare groups  Barrier to discharge identified:No.  Limited support.  Refused residential treatment  Summary and Recommendations: Patient encourage to be compliant with medications and follow up with outpatient recommedations    [name] 10/17/2011, 10:49 AM

## 2011-10-17 NOTE — Progress Notes (Signed)
Patient discharged to home.  Discharge instructions, med samples and prescriptions given to patient who verbalized understanding.  Bus pass given because patient's ride could not come this evening.

## 2011-10-17 NOTE — Progress Notes (Signed)
[facility] MD Progress Note  10/17/2011 1:44 PM  Diagnosis:   Axis I: Alcohol Abuse, Generalized Anxiety Disorder, Major Depression, Recurrent severe, Substance Induced Mood Disorder and Social Phobia Axis III:  Past Medical History  Diagnosis Date  . Hypertension     ADL's:  Intact  Sleep: Fair, started having bad dreams at about 0300 to 0330  Appetite:  Good  Suicidal Ideation:  Pt denies any suicidal thinking. Homicidal Ideation:  Denies adamantly any homicidal thoughts.  Mental Status Examination/Evaluation: Objective:  Appearance: Disheveled  Eye Contact::  Fair  Speech:  Normal Rate  Volume:  Normal  Mood:  Anxious and Depressed  Affect:  Congruent  Orientation:  Full  Thought Content:  WDL  Suicidal Thoughts:  No  Homicidal Thoughts:  No  Memory:  Immediate;   Fair  Judgement:  Fair  Insight:  Fair  Psychomotor Activity:  Normal  Concentration:  Good  Recall:  Good  Akathisia:  No  AIMS (if indicated):     Assets:  Communication Skills Desire for Improvement  Sleep:  Number of Hours: 5.5    Vital Signs:Blood pressure 143/99, pulse 92, temperature 97.9 F (36.6 C), temperature source Oral, resp. rate 18, height 6\' 1"  (1.854 m), weight 72.122 kg (159 lb). Current Medications: Current Facility-Administered Medications  Medication Dose Route Frequency Provider Last Rate Last Dose  . acetaminophen (TYLENOL) tablet 650 mg  650 mg Oral Q6H PRN Jorje Guild, PA-C      . alum & mag hydroxide-simeth (MAALOX/MYLANTA) 200-200-20 MG/5ML suspension 30 mL  30 mL Oral Q4H PRN Jorje Guild, PA-C      . chlordiazePOXIDE (LIBRIUM) capsule 25 mg  25 mg Oral Q6H PRN Jorje Guild, PA-C   25 mg at 10/15/11 1118  . FLUoxetine (PROZAC) capsule 10 mg  10 mg Oral Daily Mike Craze, MD   10 mg at 10/17/11 1249  . gabapentin (NEURONTIN) capsule 100 mg  100 mg Oral NOW Mike Craze, MD   100 mg at 10/17/11 1144  . gabapentin (NEURONTIN) tablet 300 mg  300 mg Oral QID Mike Craze, MD      .  hydrOXYzine (ATARAX/VISTARIL) tablet 25 mg  25 mg Oral Q6H PRN Jorje Guild, PA-C   25 mg at 10/15/11 1118  . loperamide (IMODIUM) capsule 2-4 mg  2-4 mg Oral PRN Jorje Guild, PA-C      . magnesium hydroxide (MILK OF MAGNESIA) suspension 30 mL  30 mL Oral Daily PRN Jorje Guild, PA-C      . multivitamin with minerals tablet 1 tablet  1 tablet Oral Daily Jorje Guild, PA-C   1 tablet at 10/17/11 0813  . nicotine (NICODERM CQ - dosed in mg/24 hours) patch 21 mg  21 mg Transdermal Daily Sanjuana Kava, NP   21 mg at 10/17/11 0656  . ondansetron (ZOFRAN-ODT) disintegrating tablet 4 mg  4 mg Oral Q6H PRN Jorje Guild, PA-C      . thiamine (B-1) injection 100 mg  100 mg Intramuscular Once Jorje Guild, PA-C      . thiamine (VITAMIN B-1) tablet 100 mg  100 mg Oral Daily Jorje Guild, PA-C   100 mg at 10/17/11 0813  . DISCONTD: carbamazepine (TEGRETOL) tablet 200 mg  200 mg Oral TID Mike Craze, MD   200 mg at 10/17/11 0813  . DISCONTD: FLUoxetine (PROZAC) capsule 10 mg  10 mg Oral Daily Mike Craze, MD      . DISCONTD: traZODone (DESYREL) tablet 50 mg  50 mg Oral QHS PRN Jorje Guild, PA-C   50 mg at 10/15/11 2231  . DISCONTD: traZODone (DESYREL) tablet 50 mg  50 mg Oral QHS PRN Mike Craze, MD        Lab Results:  Results for orders placed during the hospital encounter of 10/15/11 (from the past 48 hour(s))  CARBAMAZEPINE LEVEL, TOTAL     Status: Normal   Collection Time   10/17/11  6:20 AM      Component Value Range Comment   Carbamazepine Lvl 7.8  4.0 - 12.0 ug/mL     Physical Findings: AIMS:  , ,  ,  ,    CIWA:  CIWA-Ar Total: 0  COWS:     Treatment Plan Summary: Daily contact with patient to assess and evaluate symptoms and progress in treatment Medication management No signs/symptoms of withdrawal and mood/anxiety less than 3/10 where 1 is the best and 10 is the worst.  Plan: Liver enzymes were much too elevated for him to stay on Tegretol.  Will switch to Neurontin for his anxiety management.  He  had been on Prozac in the past for depression.  He describes OCD thinking real bad.  Will restart Prozac for the depression, anxiety, and OCD.  I informed him of Inderal as an option to help with his social anxiety. Will D/C today.  Dwayne Cortez 10/17/2011, 1:44 PM
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
## 2011-10-17 NOTE — Progress Notes (Signed)
[facility] Group Notes:  (Counselor/Nursing/MHT/Case Management/Adjunct) 10/17/2011  1:15pm Emotion Regulation   Type of Therapy:  Group Therapy  Participation Level:  Did Not Attend     [name] 10/17/2011   4:30 PM          [facility] Group Notes:  (Counselor/Nursing/MHT/Case Management/Adjunct) 10/17/2011  1:15pm Unmet Needs   Type of Therapy:  Group Therapy  Participation Level:  Did Not Attend     [name] 10/17/2011   4:30 PM

## 2011-10-17 NOTE — Progress Notes (Signed)
10/17/2011         Time: 1415      Group Topic/Focus: The focus of this group is on enhancing patients' problem solving skills, which involves identifying the problem, brainstorming solutions and choosing and trying a solution.    Participation Level: Active  Participation Quality: Appropriate and Attentive  Affect: Excited  Cognitive: Oriented   Additional Comments: Patient animated, joking with staff and peers.   Eladia Frame 10/17/2011 3:35 PM

## 2011-10-17 NOTE — Tx Team (Signed)
Interdisciplinary Treatment Plan Update (Adult)  Date:  10/17/2011  Time Reviewed:  10:48 AM   Progress in Treatment: Attending groups: Yes Participating in groups:  No Taking medication as prescribed:  Yes Tolerating medication: Yes Family/Significant othe contact made: No, Dwayne Cortez refused to allow contact with support system Patient understands diagnosis: Yes Discussing patient identified problems/goals with staff:  Yes Medical problems stabilized or resolved: Yes Denies suicidal/homicidal ideation: Yes Issues/concerns per patient self-inventory:  No  Other:  New problem(s) identified: None  Reason for Continuation of Hospitalization: Appropriate for discharge  Interventions implemented related to continuation of hospitalization:  Medication stabilization, safety checks q 15 mins, group attendance  Additional comments:  Estimated length of stay: discharge today  Discharge Plan: Dwayne Cortez will discharge to live with a friend in [facility] and will follow up with [facility]  New goal(s):  Review of initial/current patient goals per problem list:  1. Goal(s): Eliminate SI/other thoughts of self harm  Met: Yes  Target date: d/c  As evidenced by: Dwayne Cortez denies any thoughts of self-harm today   2. Goal (s): Reduce depression/anxiety to 4 or less Met: Yes Target date: d/c  As evidenced by: Dwayne Cortez reports that his depression is at a 1 and his anxiety at a 3, though the anxiety is "the good kind, not the worrying kind"   3. Goal(s): Stabilize on meds  Met: Yes Target date: d/c  As evidenced by: Dwayne Cortez reports medications working to reduce symptoms with no intolerable side effects; Liver enzymes too high, such that Tegretol is being changed to Neurontin   4. Goal(s): Refer for outpatient follow up  Met: Yes  Target date: d/c  As evidenced by: Referral made for outpatient follow up with Dwayne Cortez     Attendees: Patient:  Dwayne Cortez 10/17/2011 11:00 AM  Family:       Physician:  Dr Orson Aloe, MD 10/17/2011 10:48 AM  Nursing:   Beckie Salts, RN 10/17/2011 10:48 AM  Case Manager:  Juline Patch, LCSW 10/17/2011 10:48 AM  Counselor:  Angus Palms, LCSW 10/17/2011 10:48 AM  Other:  Reyes Ivan, LCSWA 10/17/2011 10:48 AM  Other:  Omelia Blackwater, RN 10/17/2011 10:48 AM  Other:     Other:      Scribe for Treatment Team:   [name], 10/17/2011 10:48 AM

## 2011-10-18 NOTE — Discharge Summary (Signed)
I agree with this D/C Summary.  

## 2012-08-04 ENCOUNTER — Encounter (HOSPITAL_COMMUNITY): Payer: Self-pay | Admitting: Emergency Medicine

## 2012-08-04 ENCOUNTER — Emergency Department (HOSPITAL_COMMUNITY)
Admission: EM | Admit: 2012-08-04 | Discharge: 2012-08-04 | Discharge status: Against Medical Advice | Payer: Self-pay | Attending: Emergency Medicine | Admitting: Emergency Medicine

## 2012-08-04 ENCOUNTER — Emergency Department (HOSPITAL_COMMUNITY): Payer: Self-pay

## 2012-08-04 DIAGNOSIS — R51 Headache: Secondary | ICD-10-CM | POA: Insufficient documentation

## 2012-08-04 DIAGNOSIS — F3289 Other specified depressive episodes: Secondary | ICD-10-CM | POA: Insufficient documentation

## 2012-08-04 DIAGNOSIS — F1010 Alcohol abuse, uncomplicated: Secondary | ICD-10-CM

## 2012-08-04 DIAGNOSIS — R079 Chest pain, unspecified: Secondary | ICD-10-CM | POA: Insufficient documentation

## 2012-08-04 DIAGNOSIS — F329 Major depressive disorder, single episode, unspecified: Secondary | ICD-10-CM | POA: Insufficient documentation

## 2012-08-04 DIAGNOSIS — Z79899 Other long term (current) drug therapy: Secondary | ICD-10-CM | POA: Insufficient documentation

## 2012-08-04 DIAGNOSIS — I10 Essential (primary) hypertension: Secondary | ICD-10-CM | POA: Insufficient documentation

## 2012-08-04 DIAGNOSIS — F1020 Alcohol dependence, uncomplicated: Secondary | ICD-10-CM | POA: Insufficient documentation

## 2012-08-04 DIAGNOSIS — F17200 Nicotine dependence, unspecified, uncomplicated: Secondary | ICD-10-CM | POA: Insufficient documentation

## 2012-08-04 DIAGNOSIS — Z87828 Personal history of other (healed) physical injury and trauma: Secondary | ICD-10-CM | POA: Insufficient documentation

## 2012-08-04 HISTORY — DX: Alcohol abuse, uncomplicated: F10.10

## 2012-08-04 LAB — COMPREHENSIVE METABOLIC PANEL
ALT: 333 U/L — ABNORMAL HIGH (ref 0–53)
Alkaline Phosphatase: 219 U/L — ABNORMAL HIGH (ref 39–117)
CO2: 29 mEq/L (ref 19–32)
Calcium: 9 mg/dL (ref 8.4–10.5)
GFR calc Af Amer: >90 mL/min (ref >90)
GFR calc non Af Amer: >90 mL/min (ref >90)
Glucose, Bld: 100 mg/dL — ABNORMAL HIGH (ref 70–99)
Sodium: 139 mEq/L (ref 135–145)

## 2012-08-04 LAB — RAPID URINE DRUG SCREEN, HOSP PERFORMED
Barbiturates: NOT DETECTED
Opiates: NOT DETECTED
Tetrahydrocannabinol: NOT DETECTED

## 2012-08-04 LAB — CBC
HCT: 46.4 % (ref 39.0–52.0)
Hemoglobin: 17.3 g/dL — ABNORMAL HIGH (ref 13.0–17.0)
MCH: 32.6 pg (ref 26.0–34.0)
RBC: 5.3 MIL/uL (ref 4.22–5.81)

## 2012-08-04 LAB — POCT I-STAT TROPONIN I

## 2012-08-04 NOTE — ED Provider Notes (Signed)
History     CSN: [id number]  Arrival date & time 08/04/12  1652   First MD Initiated Contact with Patient 08/04/12 1909      Chief Complaint  Patient presents with  . Chest Pain  . Addiction Problem  . Medical Clearance    (Consider location/radiation/quality/duration/timing/severity/associated sxs/prior treatment) Patient is a 42 y.o. male presenting with alcohol problem.  Alcohol Problem This is a chronic problem. The current episode started more than 1 year ago. The problem occurs 2 to 4 times per day. The problem has been gradually worsening. Associated symptoms include chest pain and headaches. The symptoms are aggravated by drinking. He has tried nothing for the symptoms.   Patient states he has had intermittent chest pain and frequent headaches for several weeks.  No recent falls, although patient reports falling with loss of consciousness after striking head in January of this year.  Heavy alcohol use, with normal intake of 10-12 beers per day.  Last alcohol consumption this morning.  Past Medical History  Diagnosis Date  . Hypertension   . Alcohol abuse     Past Surgical History  Procedure Laterality Date  . Knee surgery      History reviewed. No pertinent family history.  History  Substance Use Topics  . Smoking status: Current Every Day Smoker -- 1.50 packs/day  . Smokeless tobacco: Not on file  . Alcohol Use: Yes     Comment: "Drinks as much as I can get my hands on." 10-12 drinks a day      Review of Systems  Cardiovascular: Positive for chest pain.  Neurological: Positive for headaches.  Psychiatric/Behavioral: Positive for dysphoric mood.  All other systems reviewed and are negative.    Allergies  Review of patient's allergies indicates no known allergies.  Home Medications   Current Outpatient Rx  Name  Route  Sig  Dispense  Refill  . FLUoxetine (PROZAC) 20 MG tablet   Oral   Take 20 mg by mouth daily.         Marland Kitchen gabapentin (NEURONTIN)  600 MG tablet   Oral   Take 600 mg by mouth 4 (four) times daily.           BP 147/93  Pulse 84  Temp(Src) 98.3 F (36.8 C) (Oral)  Resp 18  SpO2 98%  Physical Exam  Nursing note and vitals reviewed. Constitutional: He is oriented to person, place, and time. He appears well-developed and well-nourished.  HENT:  Head: Normocephalic and atraumatic.  Eyes: Pupils are equal, round, and reactive to light.  Neck: Normal range of motion. Neck supple.  Cardiovascular: Normal rate and regular rhythm.   Pulmonary/Chest: Effort normal and breath sounds normal.  Abdominal: Soft. Bowel sounds are normal. There is tenderness.  Musculoskeletal: Normal range of motion. He exhibits no edema and no tenderness.  Lymphadenopathy:    He has no cervical adenopathy.  Neurological: He is alert and oriented to person, place, and time.  Skin: Skin is warm and dry.  Psychiatric: His speech is rapid and/or pressured. He is withdrawn. Cognition and memory are normal. He expresses impulsivity. He exhibits a depressed mood. He expresses no homicidal and no suicidal ideation.    ED Course  Procedures (including critical care time)  Labs Reviewed  CBC - Abnormal; Notable for the following:    Hemoglobin 17.3 (*)    MCHC 37.3 (*)    All other components within normal limits  COMPREHENSIVE METABOLIC PANEL - Abnormal; Notable for the following:  Glucose, Bld 100 (*)    BUN 4 (*)    AST 284 (*)    ALT 333 (*)    Alkaline Phosphatase 219 (*)    All other components within normal limits  ETHANOL - Abnormal; Notable for the following:    Alcohol, Ethyl (B) 310 (*)    All other components within normal limits  SALICYLATE LEVEL - Abnormal; Notable for the following:    Salicylate Lvl <2.0 (*)    All other components within normal limits  ACETAMINOPHEN LEVEL  URINE RAPID DRUG SCREEN (HOSP PERFORMED)  POCT I-STAT TROPONIN I   Dg Chest 2 View  08/04/2012  *RADIOLOGY REPORT*  Clinical Data: Chest pain   CHEST - 2 VIEW  Comparison: 12/18/2010  Findings: The lungs are clear without focal infiltrate, edema, pneumothorax or pleural effusion. The cardiopericardial silhouette is within normal limits for size. Imaged bony structures of the thorax are intact.  IMPRESSION: Stable.  No acute findings.   Original Report Authenticated By: Kennith Center, M.D.      No diagnosis found.  Discussed with ACT--will see patient.  Nursing staff advises patient decided to leave after being informed he would need to change into paper scrubs before being placed in Pod C awaiting ACT assessment.  MDM          Jimmye Norman, NP 08/04/12 [phone]

## 2012-08-04 NOTE — ED Notes (Signed)
Pt c/o left sided CP and HA x 2 weeks; pt requesting detox from ETOH and sts last drank today

## 2012-08-06 ENCOUNTER — Encounter (HOSPITAL_COMMUNITY): Payer: Self-pay | Admitting: *Deleted

## 2012-08-06 ENCOUNTER — Emergency Department (HOSPITAL_COMMUNITY)
Admission: EM | Admit: 2012-08-06 | Discharge: 2012-08-07 | Disposition: A | Discharge status: Home / Self Care | Payer: Self-pay | Attending: Emergency Medicine | Admitting: Emergency Medicine

## 2012-08-06 DIAGNOSIS — I10 Essential (primary) hypertension: Secondary | ICD-10-CM | POA: Insufficient documentation

## 2012-08-06 DIAGNOSIS — F1010 Alcohol abuse, uncomplicated: Secondary | ICD-10-CM | POA: Insufficient documentation

## 2012-08-06 DIAGNOSIS — F411 Generalized anxiety disorder: Secondary | ICD-10-CM | POA: Insufficient documentation

## 2012-08-06 DIAGNOSIS — F17200 Nicotine dependence, unspecified, uncomplicated: Secondary | ICD-10-CM | POA: Insufficient documentation

## 2012-08-06 DIAGNOSIS — Z79899 Other long term (current) drug therapy: Secondary | ICD-10-CM | POA: Insufficient documentation

## 2012-08-06 LAB — ACETAMINOPHEN LEVEL: Acetaminophen (Tylenol), Serum: <15 ug/mL (ref 10–30)

## 2012-08-06 LAB — COMPREHENSIVE METABOLIC PANEL
ALT: 290 U/L — ABNORMAL HIGH (ref 0–53)
Albumin: 3.8 g/dL (ref 3.5–5.2)
Alkaline Phosphatase: 260 U/L — ABNORMAL HIGH (ref 39–117)
Potassium: 3.9 mEq/L (ref 3.5–5.1)
Sodium: 137 mEq/L (ref 135–145)
Total Protein: 7.2 g/dL (ref 6.0–8.3)

## 2012-08-06 LAB — CBC
MCHC: 37.2 g/dL — ABNORMAL HIGH (ref 30.0–36.0)
Platelets: 219 10*3/uL (ref 150–400)
RDW: 14.2 % (ref 11.5–15.5)

## 2012-08-06 LAB — RAPID URINE DRUG SCREEN, HOSP PERFORMED
Barbiturates: NOT DETECTED
Benzodiazepines: NOT DETECTED
Cocaine: NOT DETECTED
Tetrahydrocannabinol: NOT DETECTED

## 2012-08-06 MED ORDER — ZOLPIDEM TARTRATE 5 MG PO TABS: 5.0000 mg | ORAL_TABLET | Freq: Every evening | ORAL | Status: DC | PRN

## 2012-08-06 MED ORDER — NICOTINE 21 MG/24HR TD PT24
21.0000 mg | MEDICATED_PATCH | Freq: Every day | TRANSDERMAL | Status: DC
  Administered 2012-08-06 – 2012-08-07 (×2): 21 mg via TRANSDERMAL
  Filled 2012-08-06 (×2): qty 1

## 2012-08-06 MED ORDER — IBUPROFEN 400 MG PO TABS: 600.0000 mg | ORAL_TABLET | Freq: Three times a day (TID) | ORAL | Status: DC | PRN

## 2012-08-06 MED ORDER — LORAZEPAM 1 MG PO TABS: 0.0000 mg | ORAL_TABLET | Freq: Two times a day (BID) | ORAL | Status: DC

## 2012-08-06 MED ORDER — THIAMINE HCL 100 MG/ML IJ SOLN
100.0000 mg | Freq: Every day | INTRAMUSCULAR | Status: DC
  Filled 2012-08-06: qty 2

## 2012-08-06 MED ORDER — ALUM & MAG HYDROXIDE-SIMETH 200-200-20 MG/5ML PO SUSP: 30.0000 mL | ORAL | Status: DC | PRN

## 2012-08-06 MED ORDER — ADULT MULTIVITAMIN W/MINERALS CH
1.0000 | ORAL_TABLET | Freq: Every day | ORAL | Status: DC
  Administered 2012-08-06 – 2012-08-07 (×2): 1 via ORAL
  Filled 2012-08-06 (×2): qty 1

## 2012-08-06 MED ORDER — LORAZEPAM 1 MG PO TABS
1.0000 mg | ORAL_TABLET | Freq: Three times a day (TID) | ORAL | Status: DC | PRN
  Filled 2012-08-06: qty 1

## 2012-08-06 MED ORDER — FOLIC ACID 1 MG PO TABS
1.0000 mg | ORAL_TABLET | Freq: Every day | ORAL | Status: DC
  Administered 2012-08-06 – 2012-08-07 (×2): 1 mg via ORAL
  Filled 2012-08-06 (×2): qty 1

## 2012-08-06 MED ORDER — LORAZEPAM 1 MG PO TABS
0.0000 mg | ORAL_TABLET | Freq: Four times a day (QID) | ORAL | Status: DC
  Administered 2012-08-06 – 2012-08-07 (×3): 1 mg via ORAL
  Filled 2012-08-06 (×2): qty 1

## 2012-08-06 MED ORDER — VITAMIN B-1 100 MG PO TABS
100.0000 mg | ORAL_TABLET | Freq: Every day | ORAL | Status: DC
  Administered 2012-08-06 – 2012-08-07 (×2): 100 mg via ORAL
  Filled 2012-08-06 (×2): qty 1

## 2012-08-06 MED ORDER — ONDANSETRON HCL 8 MG PO TABS: 4.0000 mg | ORAL_TABLET | Freq: Three times a day (TID) | ORAL | Status: DC | PRN

## 2012-08-06 NOTE — ED Provider Notes (Signed)
History    This chart was scribed for non-physician practitioner Dierdre Forth, PA-C working with Loren Racer, MD by Gerlean Ren, ED Scribe. This patient was seen in room TR10C/TR10C and the patient's care was started at 7:35 PM.    CSN: [id number]  Arrival date & time 08/06/12  1845   First MD Initiated Contact with Patient 08/06/12 1931      Chief Complaint  Patient presents with  . Medical Clearance     The history is provided by the patient and medical records. No language interpreter was used.  Dwayne Cortez is a 42 y.o. male who presents to the Emergency Department wanting to detox from alcohol dependence.  Pt reports he drinks approximately 10 beers per day and that he usually needs 2 beers in the morning to get the day started.  Pt denies SI, HI, hallucinations, abdominal pain, dysuria, hematuria.  Pt reports h/o drug use but states he has not used cocaine for 3-4 weeks.  Pt was last in detox at [facility] 1.5-2 years ago.  Pt regularly takes Prozac and Neurontin for depression but states he has not taken them for the past 2 weeks.   Past Medical History  Diagnosis Date  . Hypertension   . Alcohol abuse     Past Surgical History  Procedure Laterality Date  . Knee surgery      History reviewed. No pertinent family history.  History  Substance Use Topics  . Smoking status: Current Every Day Smoker -- 1.50 packs/day  . Smokeless tobacco: Not on file  . Alcohol Use: Yes     Comment: "Drinks as much as I can get my hands on." 10-12 drinks a day      Review of Systems See HPI. Allergies  Review of patient's allergies indicates no known allergies.  Home Medications   Current Outpatient Rx  Name  Route  Sig  Dispense  Refill  . FLUoxetine (PROZAC) 20 MG tablet   Oral   Take 10 mg by mouth daily.          Marland Kitchen gabapentin (NEURONTIN) 600 MG tablet   Oral   Take 600 mg by mouth 3 (three) times daily.            BP 168/99  Pulse 88  Temp(Src)  98.3 F (36.8 C) (Oral)  Resp 18  SpO2 98%  Physical Exam  Nursing note and vitals reviewed. Constitutional: He is oriented to person, place, and time. He appears well-developed and well-nourished. No distress.  HENT:  Head: Normocephalic and atraumatic.  Right Ear: Tympanic membrane, external ear and ear canal normal.  Left Ear: Tympanic membrane, external ear and ear canal normal.  Nose: Nose normal. No mucosal edema or rhinorrhea.  Mouth/Throat: Uvula is midline and oropharynx is clear and moist. Mucous membranes are not dry and not cyanotic. No oropharyngeal exudate, posterior oropharyngeal edema, posterior oropharyngeal erythema or tonsillar abscesses.  Eyes: Conjunctivae and EOM are normal. Pupils are equal, round, and reactive to light.  Neck: Normal range of motion. Neck supple. No tracheal deviation present.  Cardiovascular: Normal rate, normal heart sounds and intact distal pulses.   Pulmonary/Chest: Effort normal and breath sounds normal. No respiratory distress.  Abdominal: Soft. Bowel sounds are normal. He exhibits no distension. There is no tenderness.  Musculoskeletal: Normal range of motion. He exhibits no edema and no tenderness.  Lymphadenopathy:    He has no cervical adenopathy.  Neurological: He is alert and oriented to person, place, and  time. He exhibits normal muscle tone. Coordination normal.  Skin: Skin is warm and dry. No rash noted. No erythema.  Psychiatric: He has a normal mood and affect. His behavior is normal.    ED Course  Procedures (including critical care time) DIAGNOSTIC STUDIES: Oxygen Saturation is 98% on room air, normal by my interpretation.    COORDINATION OF CARE: 7:42 PM- Informed pt that I will perform medical clearance so that pt can received ACT team evaluation for placement.  Pt verbalizes understanding and agrees with plan.      No results found.   1. Alcohol abuse   2. Generalized anxiety disorder       MDM  Dwayne Cortez presents for EtOH detox.  Patient has been medically cleared in the ED and is awaiting consult by ACT team for possible placement vs OP clinic information. Pt is currently not having SI or HI and appears stable in NAD. Patient with elevated liver enzymes per the norm and alcohol level of 203. Pt is cleared to be moved back to [facility].   I personally performed the services described in this documentation, which was scribed in my presence. The recorded information has been reviewed and is accurate.   Dahlia Client Elvyn Krohn, PA-C 08/07/12 [phone]

## 2012-08-06 NOTE — ED Notes (Signed)
Pt presents to ED for detox from alcohol.  Pt has been drinking a 12 pack of beer/day for the last couple of months- states before that he was sober for 2 1/2 months.  Pt admits that increased family stress is what brought him to drinking again- states he has been struggling with staying sober for a long time.  Pt last drink was around 5pm today.  Appears anxious upon initial interview - 1mg  Ativan given per CIWA of 7.  Pt in scrubs and wanded by security.  Pt calm and cooperative at present - will continue to monitor pt.

## 2012-08-06 NOTE — ED Notes (Signed)
Pt st's he wants detox from alcohol.  Last drink was earlier today.  Pt denies SI or HI.  Blue scrubs given to pt.
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
## 2012-08-06 NOTE — ED Notes (Signed)
Pt is requesting detox from etoh, last drank today. Denies any SI or HI.

## 2012-08-06 NOTE — ED Provider Notes (Signed)
Medical screening examination/treatment/procedure(s) were performed by non-physician practitioner and as supervising physician I was immediately available for consultation/collaboration.  Trenell Moxey T Juliah Scadden, MD 08/06/12 1704 

## 2012-08-07 NOTE — BH Assessment (Signed)
Assessment Note  Update:  Pt accepted at [facility].  This confirmed with Catalina Gravel @ 1243.  Pt to be transported via bus and train to [facility].  Passes obtained by Elonda Husky, ED SW.  Soyla Murphy to give her an ETA for the pt.  Updated EDP [place] and ED staff.  Pt to be discharged from [facility] to [facility].  Updated assessment disposition.    Disposition:  Disposition Initial Assessment Completed for this Encounter: Yes Disposition of Patient: Inpatient treatment program Type of inpatient treatment program: Adult Patient referred to: [facility]  On Site Evaluation by:   Reviewed with Physician:  Renee Rival 08/07/2012 12:40 PM

## 2012-08-07 NOTE — ED Notes (Signed)
Explained to pt. That he will be given a bus pass [place].  He then has a train ticket to go to his destination for detox.  Pt. Verbalizes understanding.

## 2012-08-07 NOTE — ED Notes (Signed)
Report given to lori,rn

## 2012-08-07 NOTE — ED Provider Notes (Signed)
Medical screening examination/treatment/procedure(s) were performed by non-physician practitioner and as supervising physician I was immediately available for consultation/collaboration.   Luisfernando Brightwell, MD 08/07/12 1510 

## 2012-08-07 NOTE — ED Provider Notes (Signed)
Pt resting comfortably, no complaints Labs reviewed - LFTs appear at baseline He has been seen by ACT, awaiting placement at either [facility] or [facility]    Joya Gaskins, MD 08/07/12 0630

## 2012-10-20 IMAGING — CR DG CHEST 2V
2 series · 2 of 2 positions shown · non-contrast
Comparison: None

CLINICAL DATA: Preop.  Knee arthroscopy.  History of hypertension,
smoking.

CHEST - 2 VIEW

[view not recorded (1 of 2)]
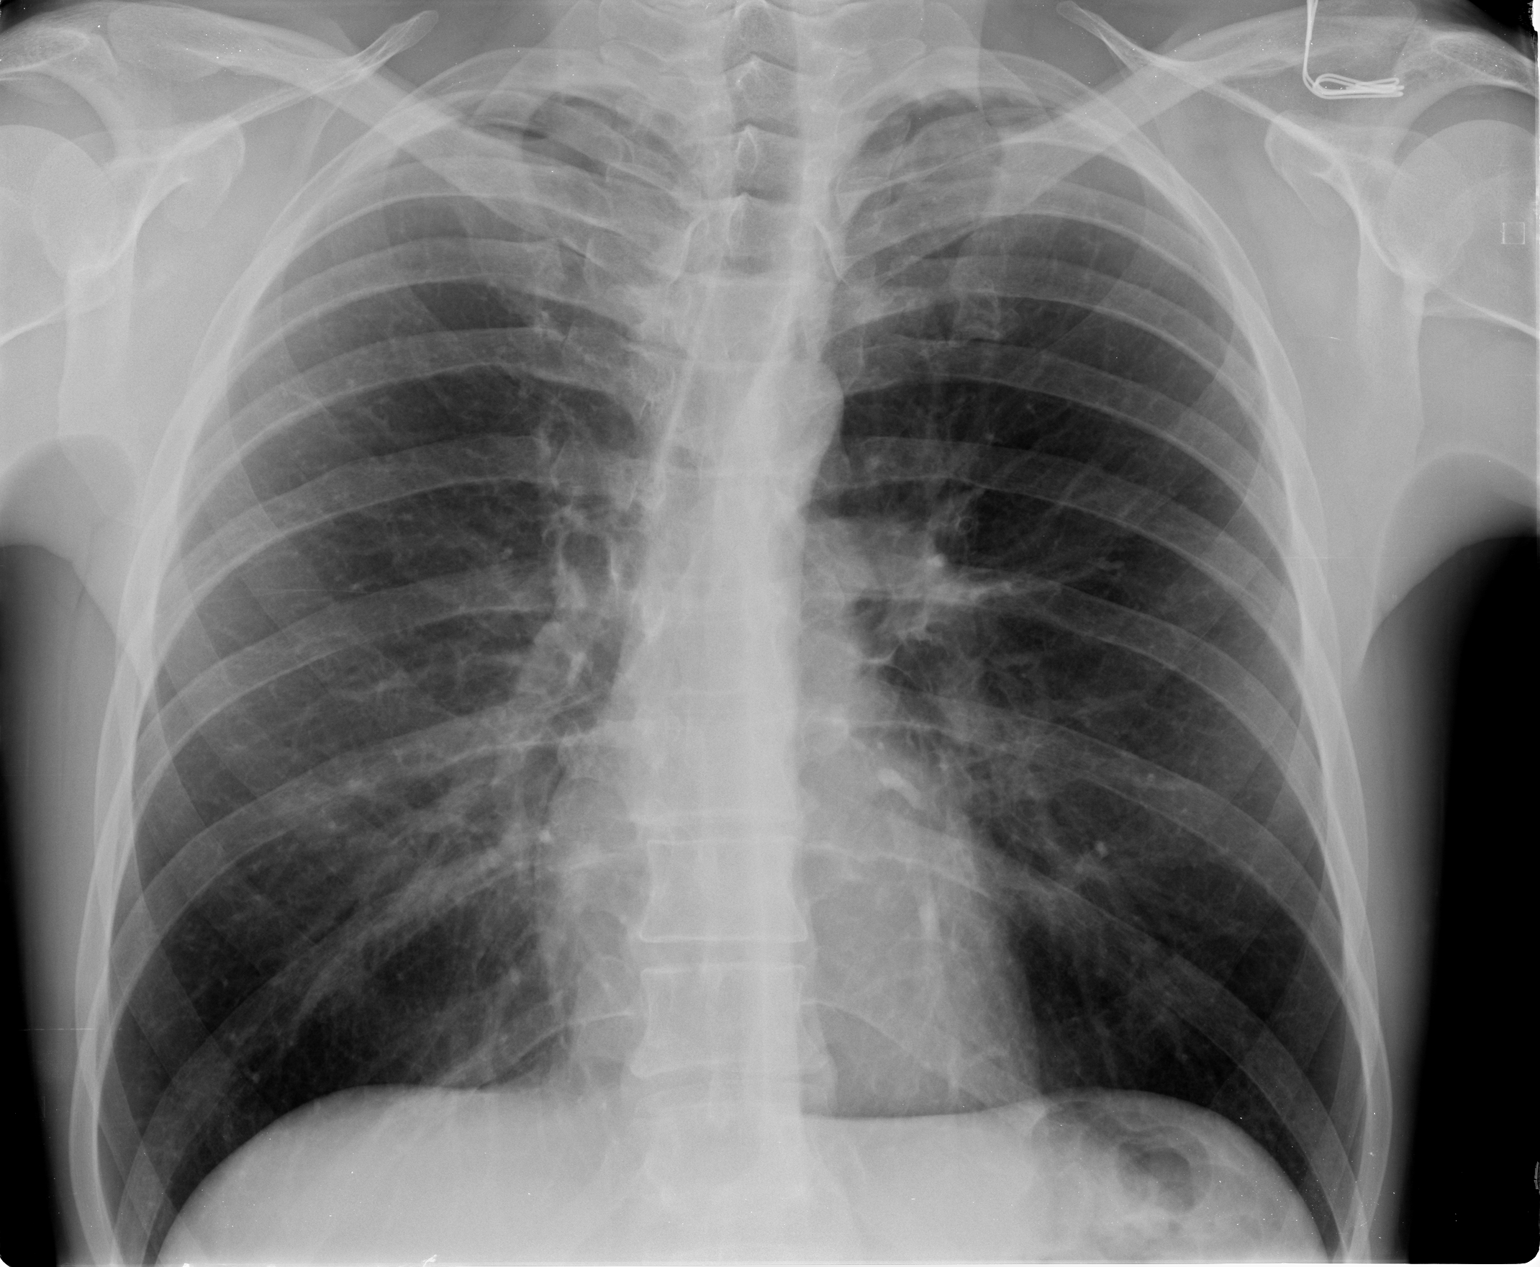

[view not recorded (2 of 2)]
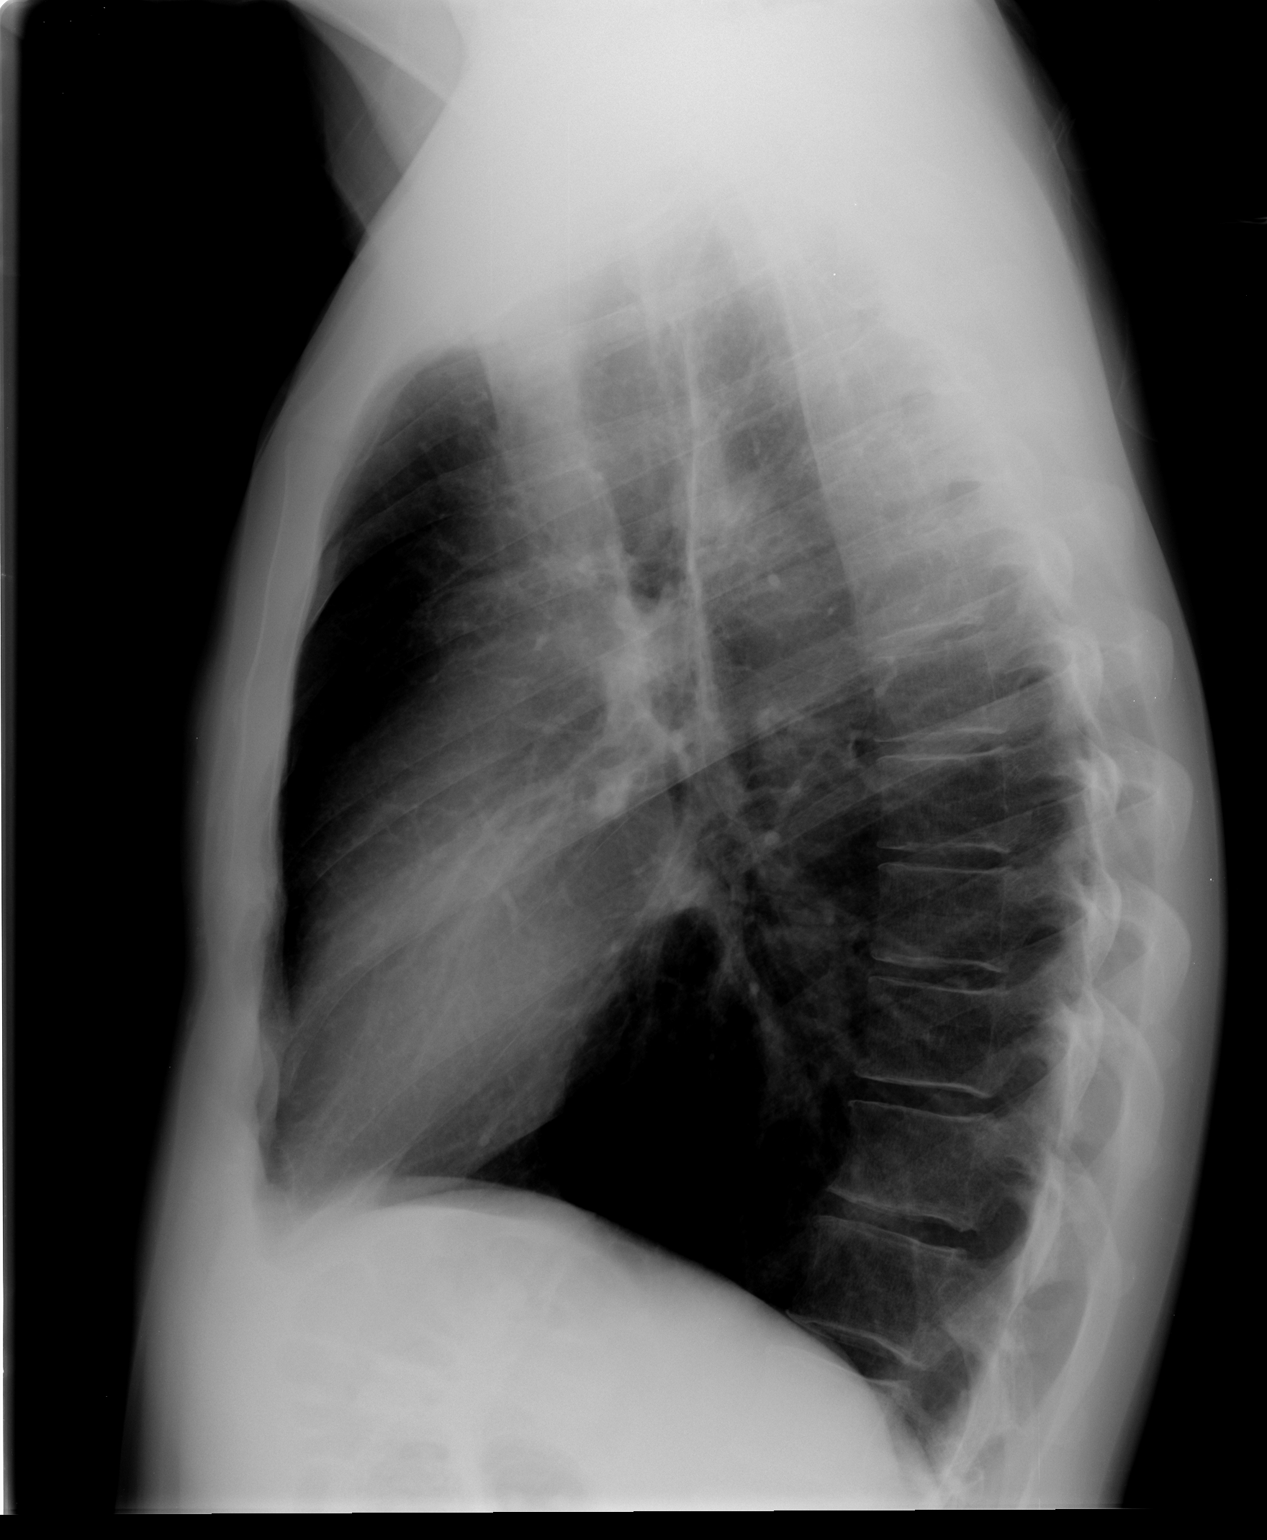

[2 of 2 positions shown; findings below may reference images not displayed]

FINDINGS: Heart size is normal.  There is significant perihilar
peribronchial thickening.  There are no focal consolidations or
pleural effusions.  No pulmonary edema.
IMPRESSION: 1.  Bronchitic changes.
2. No focal pulmonary abnormality.

## 2014-06-07 IMAGING — CR DG CHEST 2V
2 series · 2 of 2 positions shown · non-contrast
Comparison: 12/18/2010

CLINICAL DATA: Chest pain

CHEST - 2 VIEW

[w chest pa]
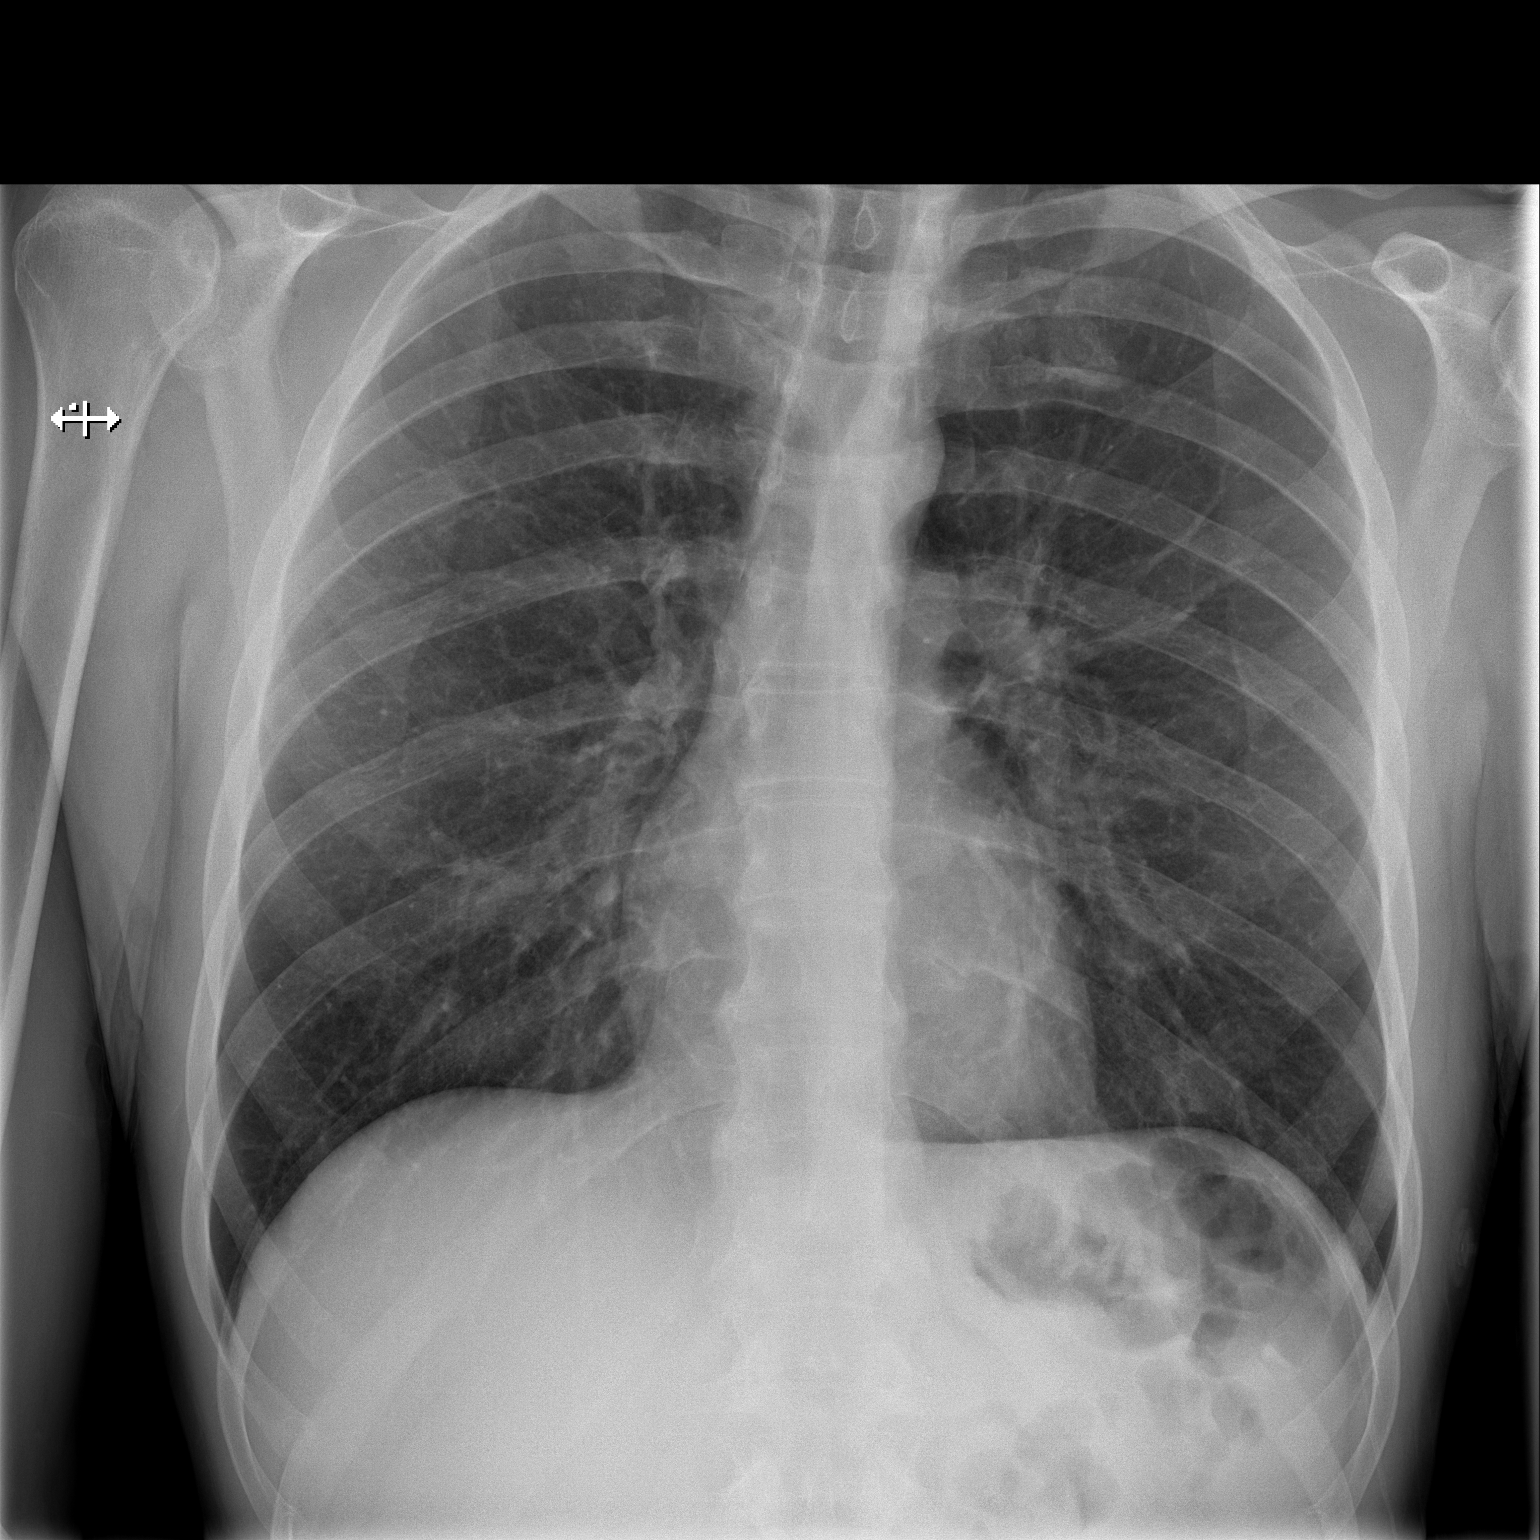

[w chest lat]
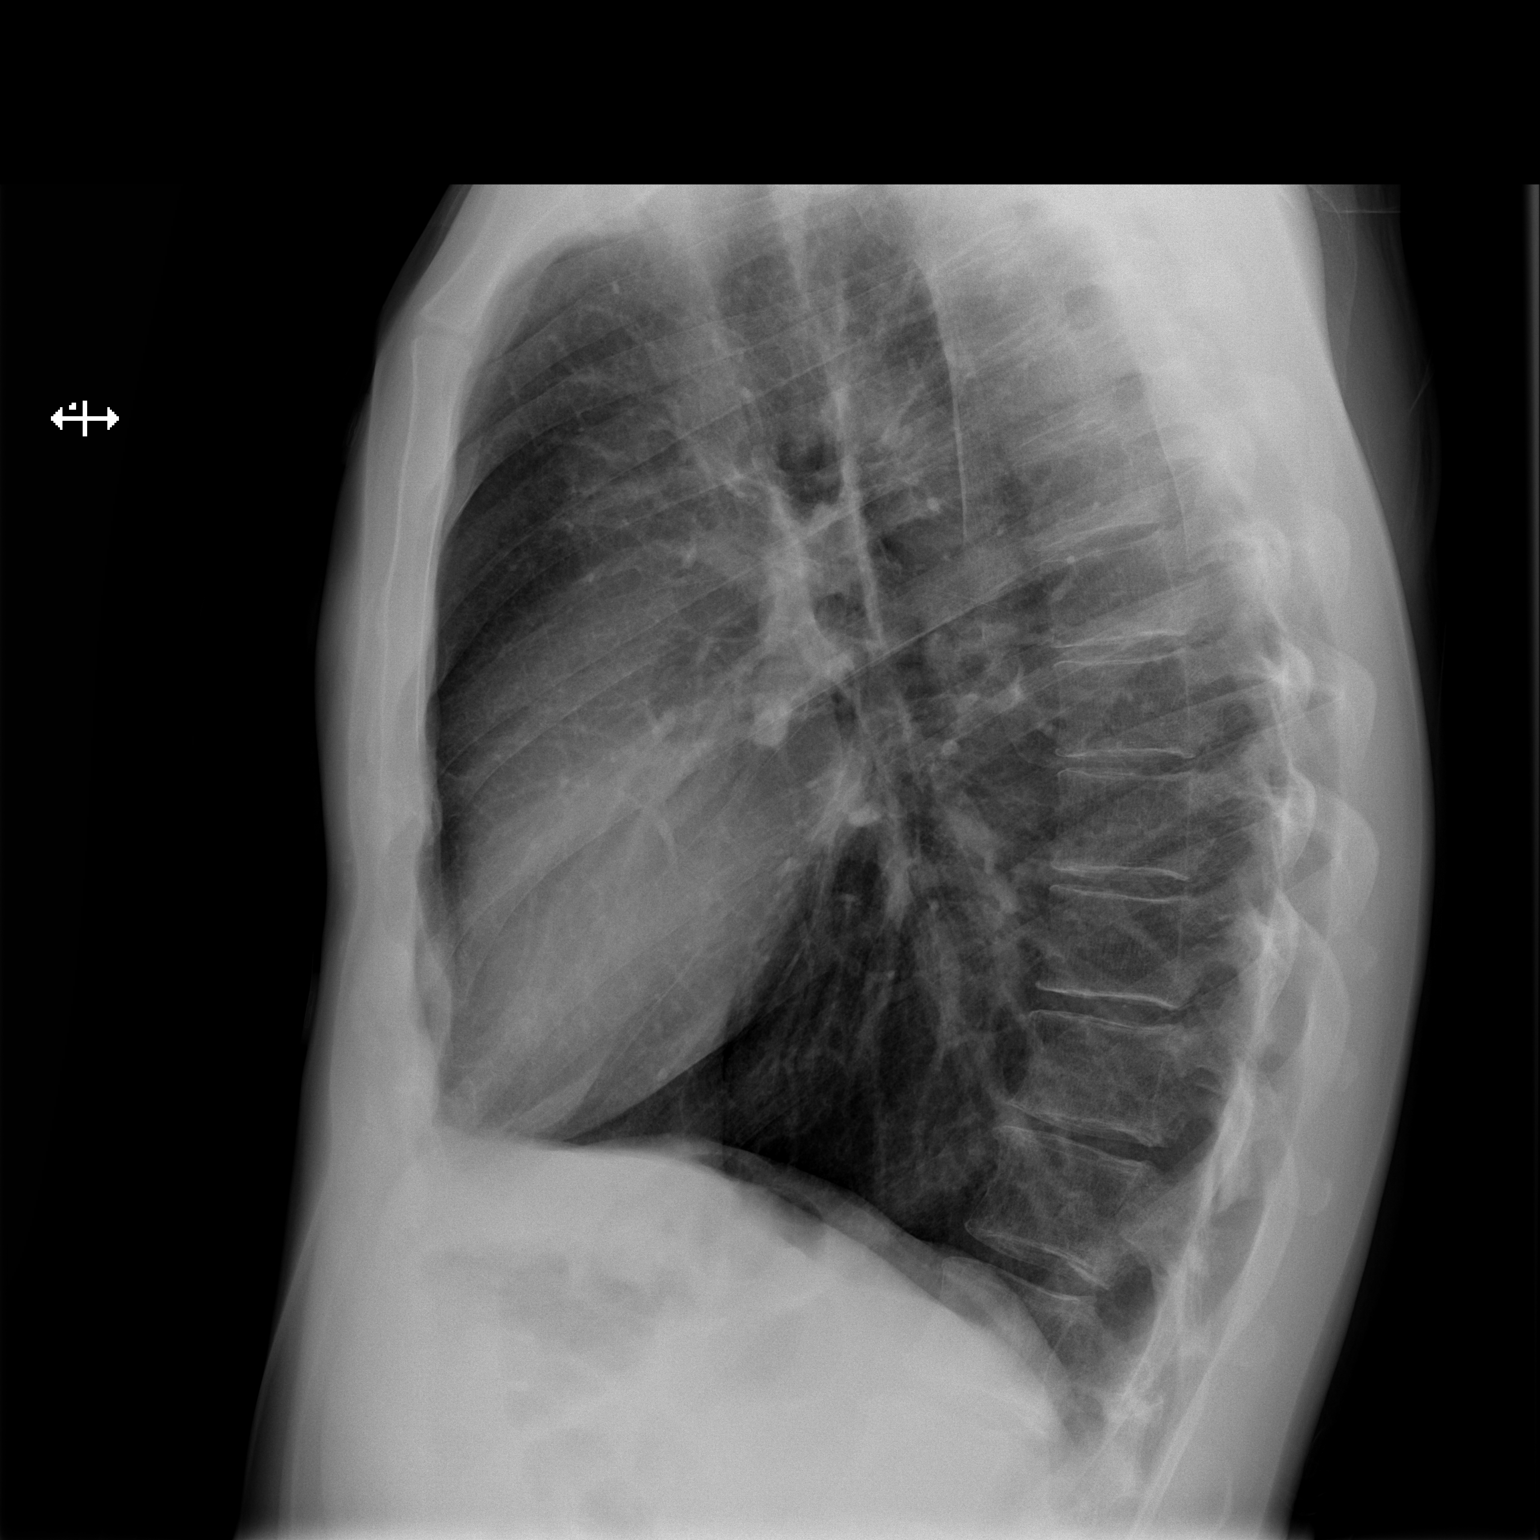

[2 of 2 positions shown; findings below may reference images not displayed]

FINDINGS: The lungs are clear without focal infiltrate, edema,
pneumothorax or pleural effusion. The cardiopericardial silhouette
is within normal limits for size. Imaged bony structures of the
thorax are intact.
IMPRESSION: Stable.  No acute findings.

## 2014-11-29 DIAGNOSIS — B1920 Unspecified viral hepatitis C without hepatic coma: Secondary | ICD-10-CM | POA: Insufficient documentation

## 2017-07-01 ENCOUNTER — Emergency Department (HOSPITAL_COMMUNITY)
Admission: EM | Admit: 2017-07-01 | Discharge: 2017-07-01 | Disposition: A | Discharge status: Home / Self Care | Payer: Self-pay | Attending: Emergency Medicine | Admitting: Emergency Medicine

## 2017-07-01 ENCOUNTER — Other Ambulatory Visit: Payer: Self-pay

## 2017-07-01 ENCOUNTER — Encounter (HOSPITAL_COMMUNITY): Payer: Self-pay | Admitting: *Deleted

## 2017-07-01 DIAGNOSIS — L02412 Cutaneous abscess of left axilla: Secondary | ICD-10-CM

## 2017-07-01 DIAGNOSIS — F17210 Nicotine dependence, cigarettes, uncomplicated: Secondary | ICD-10-CM | POA: Insufficient documentation

## 2017-07-01 DIAGNOSIS — I10 Essential (primary) hypertension: Secondary | ICD-10-CM | POA: Insufficient documentation

## 2017-07-01 DIAGNOSIS — Z79899 Other long term (current) drug therapy: Secondary | ICD-10-CM | POA: Insufficient documentation

## 2017-07-01 DIAGNOSIS — L03112 Cellulitis of left axilla: Secondary | ICD-10-CM | POA: Insufficient documentation

## 2017-07-01 MED ORDER — LIDOCAINE-EPINEPHRINE (PF) 2 %-1:200000 IJ SOLN
10.0000 mL | Freq: Once | INTRAMUSCULAR | Status: AC
  Administered 2017-07-01: 10 mL
  Filled 2017-07-01: qty 20

## 2017-07-01 MED ORDER — CEPHALEXIN 500 MG PO CAPS: 500.0000 mg | ORAL_CAPSULE | Freq: Four times a day (QID) | ORAL | 0 refills | Status: DC

## 2017-07-01 NOTE — ED Notes (Signed)
Patient verbalized understanding of discharge instructions and denies any further needs or questions at this time. VS stable. Patient ambulatory with steady gait.  

## 2017-07-01 NOTE — Discharge Instructions (Signed)
You were seen here today for pain and redness to the left axilla. You appear to have cellulitis and an abscess. You were treated with incision and drainge. Please follow aftercare instructions. Please follow up in 2 days for wound recheck. Please take all of your antibiotics until finished!   You may develop abdominal discomfort or diarrhea from the antibiotic.  You may help offset this with probiotics which you can buy or get in yogurt. Do not eat or take the probiotics until 2 hours after your antibiotic. Do not take your medicine if develop an itchy rash, swelling in your mouth or lips, or difficulty breathing.  Contact a doctor if: You have a fever. Your symptoms do not get better after 1-2 days of treatment. Your bone or joint under the infected area starts to hurt after the skin has healed. Your infection comes back. This can happen in the same area or another area. You have a swollen bump in the infected area. You have new symptoms. You feel ill and also have muscle aches and pains. Get help right away if: Your symptoms get worse. You feel very sleepy. You throw up (vomit) or have watery poop (diarrhea) for a long time. There are red streaks coming from the infected area. Your red area gets larger. Your red area turns darker.

## 2017-07-01 NOTE — ED Triage Notes (Signed)
Pt states he has abscess under left arm for several days causing pain. No acute distress is noted at triage.

## 2017-07-01 NOTE — ED Provider Notes (Signed)
[facility]   CSN: [id number] Arrival date & time: 07/01/17  1143     History   Chief Complaint Chief Complaint  Patient presents with  . Abscess    HPI Dwayne Cortez is a 47 y.o. male 3 hypertension who presents the emergency department today for skin changes under the left arm over the last 1 week.  Patient notes that he has developed redness, swelling with minimal drainage to several areas underneath his left axilla.  He initially thought this was from his deodorant and has changed this several times without relief.  He notes that the largest area on the inferior portion of his left axilla had drainage that was white in color yesterday.  Reports minimal surrounding redness.  He reports he has never had this before.  He denies any fever, chills, body aches, trauma, nausea/vomiting.  Denies IV drug use.  Notes he is prediabetic but denies history of diabetes.  No history of HIV, chronic steroid use or other immune compromise conditions.  HPI  Past Medical History:  Diagnosis Date  . Alcohol abuse   . Hypertension     Patient Active Problem List   Diagnosis Date Noted  . Generalized anxiety disorder 10/17/2011  . Social phobia 10/17/2011  . Cocaine abuse (HCC) 10/16/2011  . Alcohol abuse, continuous 10/15/2011  . 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 KNEE SURGERY         Home Medications    Prior to Admission medications   Medication Sig Start Date End Date Taking? Authorizing Provider  FLUoxetine (PROZAC) 20 MG tablet Take 10 mg by mouth daily.     [provider]  gabapentin (NEURONTIN) 600 MG tablet Take 600 mg by mouth 3 (three) times daily.     [provider]    Family History History reviewed. No pertinent family history.  Social History Social History   Tobacco Use  . Smoking  status: Current Every Day Smoker    Packs/day: 1.50  Substance Use Topics  . Alcohol use: Yes    Comment: "Drinks as much as I can get my hands on." 10-12 drinks a day  . Drug use: Yes    Types: Cocaine    Comment: hx of cocaine and heroin     Allergies   Patient has no known allergies.   Review of Systems Review of Systems  All other systems reviewed and are negative.    Physical Exam Updated Vital Signs BP (!) 167/93 (BP Location: Right Arm)   Pulse 82   Temp 98.6 F (37 C) (Oral)   Resp 18   SpO2 99%   Physical Exam  Constitutional: He appears well-developed and well-nourished.  HENT:  Head: Normocephalic and atraumatic.  Right Ear: External ear normal.  Left Ear: External ear normal.  Eyes: Conjunctivae are normal. Right eye exhibits no discharge. Left eye exhibits no discharge. No scleral icterus.  Cardiovascular:  Pulses:      Radial pulses are 2+ on the right side, and 2+ on the left side.  Pulmonary/Chest: Effort normal. No respiratory distress.  Neurological: He is alert. He has normal strength. No sensory deficit.  Skin: Skin is warm and dry. Capillary refill takes less than 2 seconds. There is erythema. No pallor.  On the inferior portion of the left axilla there is a 1cm x 0.5cm induration with small  area of fluctuance and overlying erythema of the left axilla. This measures 1cm at it's largest.  Minimal TTP. Small area of pus like drainage on the lateral aspect. No surrounding induration. Superior to this there is a 0.3cm area of induration with overlying erythema and heat. No drainage or fluctuance.   Psychiatric: He has a normal mood and affect.  Nursing note and vitals reviewed.    ED Treatments / Results  Labs (all labs ordered are listed, but only abnormal results are displayed) Labs Reviewed - No data to display  EKG  EKG Interpretation None       Radiology No results found.  Procedures .Marland KitchenIncision and Drainage Date/Time: 07/01/2017  3:18 PM Performed by: Jacinto Halim, PA-C Authorized by: Jacinto Halim, PA-C   Consent:    Consent obtained:  Verbal   Consent given by:  Patient   Risks discussed:  Bleeding, damage to other organs, infection, incomplete drainage and pain   Alternatives discussed:  No treatment Location:    Type:  Abscess   Location:  Upper extremity   Upper extremity location:  Arm   Arm location: axilla. Pre-procedure details:    Skin preparation:  Betadine Anesthesia (see MAR for exact dosages):    Anesthesia method:  Local infiltration   Local anesthetic:  Lidocaine 2% WITH epi Procedure type:    Complexity:  Simple Procedure details:    Incision types:  Single straight   Scalpel blade:  11   Wound management:  Probed and deloculated   Drainage:  Purulent and bloody   Drainage amount:  Scant   Wound treatment:  Wound left open   Packing materials:  None Post-procedure details:    Patient tolerance of procedure:  Tolerated well, no immediate complications    (including critical care time)  Medications Ordered in ED Medications  lidocaine-EPINEPHrine (XYLOCAINE W/EPI) 2 %-1:200000 (PF) injection 10 mL (10 mLs Infiltration Given by Other 07/01/17 1417)     Initial Impression / Assessment and Plan / ED Course  I have reviewed the triage vital signs and the nursing notes.  Pertinent labs & imaging results that were available during my care of the patient were reviewed by me and considered in my medical decision making (see chart for details).     47 y.o. male with 2 areas of left axilla that appear to have a overlying soft tissue cellulitis.  The inferior area with abscess amenable to incision and drainage with mild purulent discharge.  Area was not large enough to warrant packing.  He is to follow-up in 2 days for wound recheck.  Due to areas of cellulitis will start the patient on antibiotics.  Encouraged home warm soaks and flushing.  Will discharge home.  Return precautions  discussed.  Final Clinical Impressions(s) / ED Diagnoses   Final diagnoses:  Cellulitis of left axilla  Abscess of axilla, left    ED Discharge Orders        Ordered    cephALEXin (KEFLEX) 500 MG capsule  4 times daily     07/01/17 1522       Princella Pellegrini 07/01/17 1525    Charlynne Pander, MD 07/02/17 [phone]

## 2018-02-23 ENCOUNTER — Other Ambulatory Visit: Payer: Self-pay

## 2018-02-23 ENCOUNTER — Emergency Department (HOSPITAL_COMMUNITY): Payer: Self-pay

## 2018-02-23 ENCOUNTER — Emergency Department (HOSPITAL_COMMUNITY)
Admission: EM | Admit: 2018-02-23 | Discharge: 2018-02-23 | Disposition: A | Discharge status: Home / Self Care | Payer: Self-pay | Attending: Emergency Medicine | Admitting: Emergency Medicine

## 2018-02-23 ENCOUNTER — Encounter (HOSPITAL_COMMUNITY): Payer: Self-pay | Admitting: Emergency Medicine

## 2018-02-23 DIAGNOSIS — L0291 Cutaneous abscess, unspecified: Secondary | ICD-10-CM

## 2018-02-23 DIAGNOSIS — F17210 Nicotine dependence, cigarettes, uncomplicated: Secondary | ICD-10-CM | POA: Insufficient documentation

## 2018-02-23 DIAGNOSIS — I10 Essential (primary) hypertension: Secondary | ICD-10-CM | POA: Insufficient documentation

## 2018-02-23 DIAGNOSIS — Z79899 Other long term (current) drug therapy: Secondary | ICD-10-CM | POA: Insufficient documentation

## 2018-02-23 DIAGNOSIS — L02213 Cutaneous abscess of chest wall: Secondary | ICD-10-CM | POA: Insufficient documentation

## 2018-02-23 LAB — BASIC METABOLIC PANEL
Anion gap: 12 (ref 5–15)
BUN: 9 mg/dL (ref 6–20)
CO2: 27 mmol/L (ref 22–32)
Calcium: 9.3 mg/dL (ref 8.9–10.3)
Chloride: 97 mmol/L — ABNORMAL LOW (ref 98–111)
Creatinine, Ser: 0.98 mg/dL (ref 0.61–1.24)
GFR calc Af Amer: >60 mL/min (ref >60)
GLUCOSE: 87 mg/dL (ref 70–99)
POTASSIUM: 3.5 mmol/L (ref 3.5–5.1)
SODIUM: 136 mmol/L (ref 135–145)

## 2018-02-23 LAB — CBC
HCT: 47 % (ref 39.0–52.0)
HEMOGLOBIN: 15.4 g/dL (ref 13.0–17.0)
MCH: 29.5 pg (ref 26.0–34.0)
MCHC: 32.8 g/dL (ref 30.0–36.0)
MCV: 90 fL (ref 80.0–100.0)
Platelets: 362 10*3/uL (ref 150–400)
RBC: 5.22 MIL/uL (ref 4.22–5.81)
RDW: 13.9 % (ref 11.5–15.5)
WBC: 12.2 10*3/uL — AB (ref 4.0–10.5)
nRBC: 0 % (ref 0.0–0.2)

## 2018-02-23 LAB — I-STAT TROPONIN, ED: TROPONIN I, POC: 0 ng/mL (ref 0.00–0.08)

## 2018-02-23 MED ORDER — LIDOCAINE-EPINEPHRINE (PF) 2 %-1:200000 IJ SOLN
20.0000 mL | Freq: Once | INTRAMUSCULAR | Status: AC
  Administered 2018-02-23: 20 mL via INTRADERMAL
  Filled 2018-02-23: qty 20

## 2018-02-23 MED ORDER — DOXYCYCLINE HYCLATE 100 MG PO CAPS: 100.0000 mg | ORAL_CAPSULE | Freq: Two times a day (BID) | ORAL | 0 refills | Status: DC

## 2018-02-23 MED ORDER — IBUPROFEN 800 MG PO TABS
800.0000 mg | ORAL_TABLET | Freq: Once | ORAL | Status: AC
  Administered 2018-02-23: 800 mg via ORAL
  Filled 2018-02-23: qty 1

## 2018-02-23 MED ORDER — ACETAMINOPHEN 500 MG PO TABS
1000.0000 mg | ORAL_TABLET | Freq: Once | ORAL | Status: AC
  Administered 2018-02-23: 1000 mg via ORAL
  Filled 2018-02-23: qty 2

## 2018-02-23 MED ORDER — OXYCODONE HCL 5 MG PO TABS
5.0000 mg | ORAL_TABLET | Freq: Once | ORAL | Status: AC
  Administered 2018-02-23: 5 mg via ORAL
  Filled 2018-02-23: qty 1

## 2018-02-23 NOTE — Discharge Instructions (Signed)
Warm compresses 4x a day.  Return to the ed for rapid spreading redness or fever.

## 2018-02-23 NOTE — ED Triage Notes (Signed)
Reports pain from abscess under left arm/chest.  Reports it makes him hurt across his chest.  Reports it has been there for 4-5 days and is draining.  No respiratory distress noted.

## 2018-02-23 NOTE — ED Notes (Signed)
Patient verbalizes understanding of discharge instructions. Opportunity for questioning and answers were provided. Armband removed by staff, pt discharged from ED.  

## 2018-02-23 NOTE — ED Provider Notes (Signed)
[facility] EMERGENCY DEPARTMENT Provider Note   CSN: [id number] Arrival date & time: 02/23/18  1916     History   Chief Complaint Chief Complaint  Patient presents with  . Chest Pain  . Abscess    under left arm    HPI Dwayne Cortez is a 47 y.o. male.  47 yo M with a chief complaint of left-sided chest wall pain.  The patient has a boil that appeared under his left armpit and is gotten worse over the past few days.  It opened up and started draining but is gotten more tender.  Feels that it wraps around his chest.  He has had some purulent drainage from it.  He denies home lancing it.  The history is provided by the patient.  Chest Pain   This is a new problem. The current episode started 2 days ago. The problem occurs constantly. The problem has been gradually worsening. Pertinent negatives include no abdominal pain, no fever, no headaches, no palpitations, no shortness of breath and no vomiting.  Abscess  Associated symptoms: no fever, no headaches and no vomiting   Illness  This is a new problem. The current episode started more than 2 days ago. The problem has been gradually worsening. Associated symptoms include chest pain. Pertinent negatives include no abdominal pain, no headaches and no shortness of breath. Nothing aggravates the symptoms. Nothing relieves the symptoms. He has tried nothing for the symptoms. The treatment provided no relief.    Past Medical History:  Diagnosis Date  . Alcohol abuse   . Hypertension     Patient Active Problem List   Diagnosis Date Noted  . Generalized anxiety disorder 10/17/2011  . Social phobia 10/17/2011  . Cocaine abuse (HCC) 10/16/2011  . Alcohol abuse, continuous 10/15/2011  . 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 KNEE SURGERY          Home Medications    Prior to Admission medications     Medication Sig Start Date End Date Taking? Authorizing Provider  cephALEXin (KEFLEX) 500 MG capsule Take 1 capsule (500 mg total) by mouth 4 (four) times daily. 07/01/17   Maczis, Elmer Sow, PA-C  doxycycline (VIBRAMYCIN) 100 MG capsule Take 1 capsule (100 mg total) by mouth 2 (two) times daily. 02/23/18   Melene Plan, DO  FLUoxetine (PROZAC) 20 MG tablet Take 10 mg by mouth daily.     [provider]  gabapentin (NEURONTIN) 600 MG tablet Take 600 mg by mouth 3 (three) times daily.     [provider]    Family History No family history on file.  Social History Social History   Tobacco Use  . Smoking status: Current Every Day Smoker    Packs/day: 1.50  . Smokeless tobacco: Never Used  Substance Use Topics  . Alcohol use: Yes    Comment: "Drinks as much as I can get my hands on." 10-12 drinks a day  . Drug use: Yes    Types: Cocaine    Comment: hx of cocaine and heroin     Allergies   Patient has no known allergies.   Review of Systems Review of Systems  Constitutional: Negative for chills and fever.  HENT: Negative for congestion and facial swelling.   Eyes: Negative for discharge and visual disturbance.  Respiratory: Negative for shortness of breath.   Cardiovascular: Positive for chest pain.  Negative for palpitations.  Gastrointestinal: Negative for abdominal pain, diarrhea and vomiting.  Musculoskeletal: Negative for arthralgias and myalgias.  Skin: Positive for wound. Negative for color change and rash.  Neurological: Negative for tremors, syncope and headaches.  Psychiatric/Behavioral: Negative for confusion and dysphoric mood.     Physical Exam Updated Vital Signs BP (!) 146/99   Pulse 85   Temp 98.4 F (36.9 C) (Oral)   Resp (!) 23   Ht 6\' 1"  (1.854 m)   Wt 72.1 kg   SpO2 96%   BMI 20.97 kg/m   Physical Exam  Constitutional: He is oriented to person, place, and time. He appears well-developed and well-nourished.  HENT:  Head:  Normocephalic and atraumatic.  Eyes: Pupils are equal, round, and reactive to light. EOM are normal.  Neck: Normal range of motion. Neck supple. No JVD present.  Cardiovascular: Normal rate and regular rhythm. Exam reveals no gallop and no friction rub.  No murmur heard. Pulmonary/Chest: No respiratory distress. He has no wheezes.  Abdominal: He exhibits no distension. There is no rebound and no guarding.  Musculoskeletal: Normal range of motion.  Large fishmouth sized open wound to the left mid axillary line about ribs 3 and 4.  Purulent drainage and induration is noted.  Neurological: He is alert and oriented to person, place, and time.  Skin: No rash noted. No pallor.  Psychiatric: He has a normal mood and affect. His behavior is normal.  Nursing note and vitals reviewed.    ED Treatments / Results  Labs (all labs ordered are listed, but only abnormal results are displayed) Labs Reviewed  BASIC METABOLIC PANEL - Abnormal; Notable for the following components:      Result Value   Chloride 97 (*)    All other components within normal limits  CBC - Abnormal; Notable for the following components:   WBC 12.2 (*)    All other components within normal limits  I-STAT TROPONIN, ED    EKG None  Radiology Dg Chest 2 View  Result Date: 02/23/2018 CLINICAL DATA:  Chest pain. EXAM: CHEST - 2 VIEW COMPARISON:  Radiographs of June 02, 2015. FINDINGS: The heart size and mediastinal contours are within normal limits. Both lungs are clear. No pneumothorax or pleural effusion is noted. The visualized skeletal structures are unremarkable. IMPRESSION: No active cardiopulmonary disease. Electronically Signed   By: Lupita Raider, M.D.   On: 02/23/2018 19:53    Procedures .Marland KitchenIncision and Drainage Date/Time: 02/24/2018 12:06 AM Performed by: Melene Plan, DO Authorized by: Melene Plan, DO   Consent:    Consent obtained:  Verbal   Consent given by:  Patient   Risks discussed:  Bleeding and  infection   Alternatives discussed:  No treatment Location:    Type:  Abscess   Location:  Trunk   Trunk location:  Chest Pre-procedure details:    Skin preparation:  Chloraprep Anesthesia (see MAR for exact dosages):    Anesthesia method:  Local infiltration   Local anesthetic:  Lidocaine 2% WITH epi Procedure type:    Complexity:  Complex Procedure details:    Needle aspiration: no     Incision depth:  Subcutaneous   Wound management:  Probed and deloculated and extensive cleaning   Drainage:  Purulent   Drainage amount:  Copious   Wound treatment:  Wound left open   Packing materials:  None Post-procedure details:    Patient tolerance of procedure:  Tolerated well, no immediate complications Comments:  Wound was already open I used lidocaine for anesthetic and then debrided the tissue remove the purulent pockets.   (including critical care time)  Medications Ordered in ED Medications  lidocaine-EPINEPHrine (XYLOCAINE W/EPI) 2 %-1:200000 (PF) injection 20 mL (20 mLs Intradermal Given 02/23/18 2025)  acetaminophen (TYLENOL) tablet 1,000 mg (1,000 mg Oral Given 02/23/18 2105)  ibuprofen (ADVIL,MOTRIN) tablet 800 mg (800 mg Oral Given 02/23/18 2105)  oxyCODONE (Oxy IR/ROXICODONE) immediate release tablet 5 mg (5 mg Oral Given 02/23/18 2105)     Initial Impression / Assessment and Plan / ED Course  I have reviewed the triage vital signs and the nursing notes.  Pertinent labs & imaging results that were available during my care of the patient were reviewed by me and considered in my medical decision making (see chart for details).     47 yo M with a chief complaint of an abscess.  Is been open and draining.  No fevers or chills.  Having some worsening with induration.  Cleaned out and irrigated the wound.  Start on abx.  D/c home.   12:08 AM:  I have discussed the diagnosis/risks/treatment options with the patient and family and believe the pt to be eligible for  discharge home to follow-up with PCP. We also discussed returning to the ED immediately if new or worsening sx occur. We discussed the sx which are most concerning (e.g., sudden worsening pain, fever, inability to tolerate by mouth) that necessitate immediate return. Medications administered to the patient during their visit and any new prescriptions provided to the patient are listed below.  Medications given during this visit Medications  lidocaine-EPINEPHrine (XYLOCAINE W/EPI) 2 %-1:200000 (PF) injection 20 mL (20 mLs Intradermal Given 02/23/18 2025)  acetaminophen (TYLENOL) tablet 1,000 mg (1,000 mg Oral Given 02/23/18 2105)  ibuprofen (ADVIL,MOTRIN) tablet 800 mg (800 mg Oral Given 02/23/18 2105)  oxyCODONE (Oxy IR/ROXICODONE) immediate release tablet 5 mg (5 mg Oral Given 02/23/18 2105)      The patient appears reasonably screen and/or stabilized for discharge and I doubt any other medical condition or other [facility] requiring further screening, evaluation, or treatment in the ED at this time prior to discharge.    Final Clinical Impressions(s) / ED Diagnoses   Final diagnoses:  Abscess    ED Discharge Orders         Ordered    doxycycline (VIBRAMYCIN) 100 MG capsule  2 times daily     02/23/18 2051           Melene Plan, DO 02/24/18 0008

## 2020-03-15 ENCOUNTER — Other Ambulatory Visit: Payer: Self-pay

## 2020-03-15 ENCOUNTER — Ambulatory Visit (INDEPENDENT_AMBULATORY_CARE_PROVIDER_SITE_OTHER): Payer: No Payment, Other | Admitting: Clinical

## 2020-03-15 DIAGNOSIS — F330 Major depressive disorder, recurrent, mild: Secondary | ICD-10-CM

## 2020-03-16 ENCOUNTER — Other Ambulatory Visit: Payer: Self-pay

## 2020-03-16 ENCOUNTER — Telehealth (INDEPENDENT_AMBULATORY_CARE_PROVIDER_SITE_OTHER): Payer: No Payment, Other | Admitting: Psychiatry

## 2020-03-16 ENCOUNTER — Encounter (HOSPITAL_COMMUNITY): Payer: Self-pay | Admitting: Psychiatry

## 2020-03-16 DIAGNOSIS — F411 Generalized anxiety disorder: Secondary | ICD-10-CM | POA: Diagnosis not present

## 2020-03-16 DIAGNOSIS — F330 Major depressive disorder, recurrent, mild: Secondary | ICD-10-CM | POA: Diagnosis not present

## 2020-03-16 MED ORDER — GABAPENTIN 600 MG PO TABS: 600.0000 mg | ORAL_TABLET | Freq: Three times a day (TID) | ORAL | 2 refills | Status: DC

## 2020-03-16 MED ORDER — FLUOXETINE HCL 20 MG PO TABS: 20.0000 mg | ORAL_TABLET | Freq: Every day | ORAL | 2 refills | Status: DC
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
## 2020-03-18 DIAGNOSIS — F330 Major depressive disorder, recurrent, mild: Secondary | ICD-10-CM | POA: Insufficient documentation
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
## 2020-03-23 ENCOUNTER — Other Ambulatory Visit (HOSPITAL_COMMUNITY): Payer: Self-pay | Admitting: Psychiatry

## 2020-03-23 ENCOUNTER — Telehealth (HOSPITAL_COMMUNITY): Payer: Self-pay | Admitting: *Deleted

## 2020-03-23 DIAGNOSIS — F330 Major depressive disorder, recurrent, mild: Secondary | ICD-10-CM

## 2020-03-23 DIAGNOSIS — F411 Generalized anxiety disorder: Secondary | ICD-10-CM

## 2020-03-23 MED ORDER — GABAPENTIN 300 MG PO CAPS: 300.0000 mg | ORAL_CAPSULE | Freq: Two times a day (BID) | ORAL | 2 refills | Status: DC

## 2020-03-23 MED ORDER — FLUOXETINE HCL 40 MG PO CAPS: 40.0000 mg | ORAL_CAPSULE | Freq: Every day | ORAL | 2 refills | Status: DC

## 2020-03-23 NOTE — Telephone Encounter (Signed)
Call from patient stating he went to pick up his meds after being seen here for the first time last week and the doses and or the amounts of the medications were not correct. Called [facility] to get med hx. Prozac was last picked up in sept at 40 mg qd and Gabapentin 300 mg BID. Will consult NP re this difference and make sure we are going forward with how it is currently written.

## 2020-03-23 NOTE — Telephone Encounter (Signed)
Provider corrected entry and medications sent to preferred pharmacy.

## 2020-03-23 NOTE — Telephone Encounter (Signed)
Toy Cookey NP changed his Rx and called them into [facility] as he chose. Called him to inform him the medicine was available and discussed with him considering changing pharmacies as [facility] tends to be higher than [place] and [employer] since he is unisured. He accepted the info and said he would look into it.

## 2020-05-10 ENCOUNTER — Other Ambulatory Visit: Payer: Self-pay

## 2020-05-10 ENCOUNTER — Telehealth (HOSPITAL_COMMUNITY): Payer: Self-pay | Admitting: Clinical

## 2020-05-10 ENCOUNTER — Ambulatory Visit (HOSPITAL_COMMUNITY): Payer: No Payment, Other | Admitting: Clinical

## 2020-05-10 NOTE — Telephone Encounter (Signed)
Client did not check in to the scheduled virtual therapy visit using the text message link sent to his cellphone. Therapist followed up with a phone call to the client. Client did not answer. Therapist left a voice mail for the client to call the office back to reschedule his appointment.

## 2020-05-24 ENCOUNTER — Other Ambulatory Visit: Payer: Self-pay

## 2020-05-24 ENCOUNTER — Ambulatory Visit (HOSPITAL_COMMUNITY): Payer: No Payment, Other | Admitting: Clinical

## 2020-06-16 ENCOUNTER — Telehealth (INDEPENDENT_AMBULATORY_CARE_PROVIDER_SITE_OTHER): Payer: No Payment, Other | Admitting: Psychiatry

## 2020-06-16 ENCOUNTER — Encounter (HOSPITAL_COMMUNITY): Payer: Self-pay | Admitting: Psychiatry

## 2020-06-16 ENCOUNTER — Other Ambulatory Visit: Payer: Self-pay

## 2020-06-16 DIAGNOSIS — F411 Generalized anxiety disorder: Secondary | ICD-10-CM

## 2020-06-16 DIAGNOSIS — F330 Major depressive disorder, recurrent, mild: Secondary | ICD-10-CM

## 2020-06-16 MED ORDER — FLUOXETINE HCL 40 MG PO CAPS: 40.0000 mg | ORAL_CAPSULE | Freq: Every day | ORAL | 2 refills | Status: DC

## 2020-06-16 MED ORDER — GABAPENTIN 300 MG PO CAPS: 300.0000 mg | ORAL_CAPSULE | Freq: Three times a day (TID) | ORAL | 2 refills | Status: DC

## 2020-06-16 NOTE — Progress Notes (Signed)
BH MD/PA/NP OP Progress Note Virtual Visit via Video Note  I connected with Dwayne Cortez on 06/16/20 at  1:00 PM EST by a video enabled telemedicine application and verified that I am speaking with the correct person using two identifiers.  Location: Patient: Home Provider: Clinic   I discussed the limitations of evaluation and management by telemedicine and the availability of in person appointments. The patient expressed understanding and agreed to proceed.  I provided 30 minutes of non-face-to-face time during this encounter.    06/16/2020 1:21 PM Dwayne Cortez  MRN:  [id number]  Chief Complaint: "I have been taking gabapentin 3 times a day"  HPI: 50 year old male seen today for follow up psychiatric evaluation. 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  He is currently managed on Prozac 40 mg daily and gabapentin 300 mg twice daily.  He notes that he has been taking gabapentin 300 mg 3 times daily and notes that it is effective in managing his psychiatric conditions.    Today patient is pleasant, well-groomed, engaged in conversation, maintained eye contact, and was cooperative.    He informed Clinical research associate that since his last visit things have been going well.  He notes that he began to take gabapentin 300 mg 3 times daily instead of twice daily and notes that its been effective in managing his anxiety.  Today provider conducted a GAD-7 and patient scored a 6, at his last visit he scored a 13.  Provider also conducted a PHQ-9 and patient scored an 11, at his last visit he scored an 8.  Today he notes that he sleeps and eats well.   He denies any SI/HI/VAH, paranoia, or mania.   Patient continues to maintain his sobriety and notes that he continues to work.  Patient agreeable to continue gabapentin 300 mg 3 times daily  instead of twice daily.  He will continue all other medications as prescribed.  No other concerns noted at this time.  Visit Diagnosis:    ICD-10-CM   1. Generalized anxiety disorder  F41.1 FLUoxetine (PROZAC) 40 MG capsule    gabapentin (NEURONTIN) 300 MG capsule  2. 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 Alcohol abuse   . Hypertension     Past Surgical History:  Procedure Laterality Date  . KNEE SURGERY      Family Psychiatric History: Denies  Family History: History reviewed. No pertinent family history.  Social History:  Social History   Socioeconomic History  . Marital status: Single    Spouse name: Not on file  . Number of children: Not on file  . Years of education: Not on file  . Highest education level: Not on file  Occupational History  . Not on file  Tobacco Use  . Smoking status: Current Every Day Smoker    Packs/day: 1.50  . Smokeless tobacco: Never Used  Substance and Sexual Activity  . Alcohol use: Yes    Comment: "Drinks as much as I can get my hands on." 10-12 drinks a day  . Drug use: Yes    Types: Cocaine  Comment: hx of cocaine and heroin  . Sexual activity: Not on file  Other Topics Concern  . Not on file  Social History Narrative  . Not on file   Social Determinants of Health   Financial Resource Strain: Not on file  Food Insecurity: Not on file  Transportation Needs: Not on file  Physical Activity: Not on file  Stress: Not on file  Social Connections: Not on file    Allergies: No Known Allergies  Metabolic Disorder Labs: No results found for: HGBA1C, MPG No results found for: PROLACTIN No results  found for: CHOL, TRIG, HDL, CHOLHDL, VLDL, LDLCALC No results found for: TSH  Therapeutic Level Labs: No results found for: LITHIUM No results found for: VALPROATE No components found for:  CBMZ  Current Medications: Current Outpatient Medications  Medication Sig Dispense Refill  . cephALEXin (KEFLEX) 500 MG capsule Take 1 capsule (500 mg total) by mouth 4 (four) times daily. 20 capsule 0  . doxycycline (VIBRAMYCIN) 100 MG capsule Take 1 capsule (100 mg total) by mouth 2 (two) times daily. 20 capsule 0  . FLUoxetine (PROZAC) 40 MG capsule Take 1 capsule (40 mg total) by mouth daily. 30 capsule 2  . gabapentin (NEURONTIN) 300 MG capsule Take 1 capsule (300 mg total) by mouth 3 (three) times daily. 90 capsule 2   No current facility-administered medications for this visit.     Musculoskeletal: Strength & Muscle Tone: Unable to assess due to telehealth visit Gait & Station: Unable to assess due to telehealth visit Patient leans: N/A  Psychiatric Specialty Exam: Review of Systems  There were no vitals taken for this visit.There is no height or weight on file to calculate BMI.  General Appearance: Well Groomed  Eye Contact:  Good  Speech:  Clear and Coherent and Normal Rate  Volume:  Normal  Mood:  Euthymic  Affect:  Appropriate and Congruent  Thought Process:  Coherent, Goal Directed and Linear  Orientation:  Full (Time, Place, and Person)  Thought Content: WDL and Logical   Suicidal Thoughts:  No  Homicidal Thoughts:  No  Memory:  Immediate;   Good Recent;   Good Remote;   Good  Judgement:  Good  Insight:  Good  Psychomotor Activity:  Normal  Concentration:  Concentration: Good and Attention Span: Good  Recall:  Good  Fund of Knowledge: Good  Language: Good  Akathisia:  No  Handed:  Right  AIMS (if indicated): Not done  Assets:  Communication Skills Desire for Improvement Financial Resources/Insurance Housing Intimacy Social Support  ADL's:  Intact  Cognition:  WNL  Sleep:  Good   Screenings: GAD-7   Flowsheet Row Video Visit from 06/16/2020 in [facility] Counselor from 03/15/2020 in [facility]  Total GAD-7 Score 6 13    PHQ2-9   Flowsheet Row Video Visit from 06/16/2020 in [facility] Counselor from 03/15/2020 in [facility]  PHQ-2 Total Score 3 2  PHQ-9 Total Score 11 8    Flowsheet Row Video Visit from 06/16/2020 in [facility]  C-SSRS RISK CATEGORY No Risk       Assessment and Plan: Patient notes that his anxiety and depression has improved since his last visit.  He informed provider that he has been taking gabapentin 300 mg 3 times daily instead of twice daily.  He is agreeable to continue this regimen.  No other medication changes.  Patient will continue medication  as prescribed.  1. Generalized anxiety disorder  Continue- FLUoxetine (PROZAC) 40 MG capsule; Take 1 capsule (40 mg total) by mouth daily.  Dispense: 30 capsule; Refill: 2 Increased- gabapentin (NEURONTIN) 300 MG capsule; Take 1 capsule (300 mg total) by mouth 3 (three) times daily.  Dispense: 90 capsule; Refill: 2  2. Mild episode of recurrent major depressive disorder (HCC)  Continue- FLUoxetine (PROZAC) 40 MG capsule; Take 1 capsule (40 mg total) by mouth daily.  Dispense: 30 capsule; Refill: 2  Follow-up in 3 months  Shanna Cisco, NP 06/16/2020, 1:21 PM

## 2020-06-30 ENCOUNTER — Other Ambulatory Visit (HOSPITAL_COMMUNITY): Payer: Self-pay | Admitting: Psychiatry

## 2020-06-30 DIAGNOSIS — F330 Major depressive disorder, recurrent, mild: Secondary | ICD-10-CM

## 2020-06-30 DIAGNOSIS — F411 Generalized anxiety disorder: Secondary | ICD-10-CM

## 2020-09-13 ENCOUNTER — Encounter (HOSPITAL_COMMUNITY): Payer: Self-pay | Admitting: Psychiatry

## 2020-09-13 ENCOUNTER — Telehealth (INDEPENDENT_AMBULATORY_CARE_PROVIDER_SITE_OTHER): Payer: No Payment, Other | Admitting: Psychiatry

## 2020-09-13 DIAGNOSIS — F411 Generalized anxiety disorder: Secondary | ICD-10-CM | POA: Diagnosis not present

## 2020-09-13 DIAGNOSIS — F330 Major depressive disorder, recurrent, mild: Secondary | ICD-10-CM

## 2020-09-13 MED ORDER — FLUOXETINE HCL 40 MG PO CAPS: ORAL_CAPSULE | ORAL | 2 refills | Status: DC

## 2020-09-13 MED ORDER — GABAPENTIN 300 MG PO CAPS: 300.0000 mg | ORAL_CAPSULE | Freq: Three times a day (TID) | ORAL | 2 refills | Status: DC

## 2020-09-13 NOTE — Progress Notes (Signed)
BH MD/PA/NP OP Progress Note Virtual Visit via Video Note  I connected with Dwayne Cortez on 09/13/20 at  3:30 PM EDT by a video enabled telemedicine application and verified that I am speaking with the correct person using two identifiers.  Location: Patient: Home Provider: Clinic   I discussed the limitations of evaluation and management by telemedicine and the availability of in person appointments. The patient expressed understanding and agreed to proceed.  I provided 30 minutes of non-face-to-face time during this encounter.    09/13/2020 3:45 PM Dwayne Cortez  MRN:  [id number]  Chief Complaint: "I have been been busy with school"  HPI: 50 year old male seen today for follow up psychiatric evaluation. 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  He is currently managed on Prozac 40 mg daily and gabapentin 300 mg three daily.  He notes that his mediations are effective in managing his psychiatric conditions.    Today patient is pleasant, well-groomed, engaged in conversation, maintained eye contact, and was cooperative.    He informed Clinical research associate that he recently graduated from [employer] and is about to start another program at [employer].  He notes that he graduated with an associates in arts and plans to get another degree in social work.  He informed provider that his anxiety and depression continue to be minimal and he is staying busy keeping up with his schoolwork.  Today provider conducted a GAD-7 and patient scored an 8, at his last visit he scored a 6.  Provider also conducted a PHQ-9 patient scored a 5, at his last visit he scored 11.  He endorses adequate sleep and appetite. He denies any SI/HI/VAH, paranoia, or mania.   No medication changes made today.  Patient agreeable to continue medications as  prescribed.  He will follow-up with outpatient counseling for therapy.  No other concerns noted at this time. Visit Diagnosis:    ICD-10-CM   1. Generalized anxiety disorder  F41.1 FLUoxetine (PROZAC) 40 MG capsule    gabapentin (NEURONTIN) 300 MG capsule  2. 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 Alcohol abuse   . Hypertension     Past Surgical History:  Procedure Laterality Date  . KNEE SURGERY      Family Psychiatric History: Denies  Family History: History reviewed. No pertinent family history.  Social History:  Social History   Socioeconomic History  . Marital status: Single    Spouse name: Not on file  . Number of children: Not on file  . Years of education: Not on file  . Highest education level: Not on file  Occupational History  . Not on file  Tobacco Use  . Smoking status: Current Every Day Smoker    Packs/day: 1.50  . Smokeless tobacco: Never Used  Substance and Sexual Activity  . Alcohol use: Yes    Comment: "Drinks as much as I can get my hands on." 10-12 drinks a day  . Drug use: Yes    Types: Cocaine    Comment: hx of cocaine and heroin  . Sexual activity: Not on  file  Other Topics Concern  . Not on file  Social History Narrative  . Not on file   Social Determinants of Health   Financial Resource Strain: Not on file  Food Insecurity: Not on file  Transportation Needs: Not on file  Physical Activity: Not on file  Stress: Not on file  Social Connections: Not on file    Allergies: No Known Allergies  Metabolic Disorder Labs: No results found for: HGBA1C, MPG No results found for: PROLACTIN No results found for:  CHOL, TRIG, HDL, CHOLHDL, VLDL, LDLCALC No results found for: TSH  Therapeutic Level Labs: No results found for: LITHIUM No results found for: VALPROATE No components found for:  CBMZ  Current Medications: Current Outpatient Medications  Medication Sig Dispense Refill  . cephALEXin (KEFLEX) 500 MG capsule Take 1 capsule (500 mg total) by mouth 4 (four) times daily. 20 capsule 0  . doxycycline (VIBRAMYCIN) 100 MG capsule Take 1 capsule (100 mg total) by mouth 2 (two) times daily. 20 capsule 0  . FLUoxetine (PROZAC) 40 MG capsule TAKE 1 CAPSULE(40 MG) BY MOUTH DAILY 30 capsule 2  . gabapentin (NEURONTIN) 300 MG capsule Take 1 capsule (300 mg total) by mouth 3 (three) times daily. 90 capsule 2   No current facility-administered medications for this visit.     Musculoskeletal: Strength & Muscle Tone: Unable to assess due to telehealth visit Gait & Station: Unable to assess due to telehealth visit Patient leans: N/A  Psychiatric Specialty Exam: Review of Systems  There were no vitals taken for this visit.There is no height or weight on file to calculate BMI.  General Appearance: Well Groomed  Eye Contact:  Good  Speech:  Clear and Coherent and Normal Rate  Volume:  Normal  Mood:  Euthymic  Affect:  Appropriate and Congruent  Thought Process:  Coherent, Goal Directed and Linear  Orientation:  Full (Time, Place, and Person)  Thought Content: WDL and Logical   Suicidal Thoughts:  No  Homicidal Thoughts:  No  Memory:  Immediate;   Good Recent;   Good Remote;   Good  Judgement:  Good  Insight:  Good  Psychomotor Activity:  Normal  Concentration:  Concentration: Good and Attention Span: Good  Recall:  Good  Fund of Knowledge: Good  Language: Good  Akathisia:  No  Handed:  Right  AIMS (if indicated): Not done  Assets:  Communication Skills Desire for Improvement Financial Resources/Insurance Housing Intimacy Social Support  ADL's:  Intact  Cognition: WNL  Sleep:  Good    Screenings: GAD-7   Flowsheet Row Video Visit from 09/13/2020 in [facility] Video Visit from 06/16/2020 in [facility] Counselor from 03/15/2020 in [facility]  Total GAD-7 Score 8 6 13     PHQ2-9   Flowsheet Row Video Visit from 09/13/2020 in [facility] Video Visit from 06/16/2020 in [facility] Counselor from 03/15/2020 in [facility]  PHQ-2 Total Score 1 3 2   PHQ-9 Total Score 5 11 8     Flowsheet Row Video Visit from 06/16/2020 in [facility]  C-SSRS RISK CATEGORY No Risk       Assessment and Plan: Patient notes that he is doing well on his current medication regimen.  No medication changes made today.  Patient agreeable to continue medication as prescribed. 1. Generalized anxiety disorder  Continue- FLUoxetine (PROZAC) 40 MG capsule; Take 1 capsule (40  mg total) by mouth daily.  Dispense: 30 capsule; Refill: 2 Continue- gabapentin (NEURONTIN) 300 MG capsule; Take 1 capsule (300 mg total) by mouth 3 (three) times daily.  Dispense: 90 capsule; Refill: 2  2. Mild episode of recurrent major depressive disorder (HCC)  Continue- FLUoxetine (PROZAC) 40 MG capsule; Take 1 capsule (40 mg total) by mouth daily.  Dispense: 30 capsule; Refill: 2  Follow-up in 3 months Follow-up with therapy  Shanna Cisco, NP 09/13/2020, 3:45 PM

## 2020-11-16 ENCOUNTER — Other Ambulatory Visit (HOSPITAL_COMMUNITY): Payer: Self-pay | Admitting: Psychiatry

## 2020-11-16 DIAGNOSIS — F411 Generalized anxiety disorder: Secondary | ICD-10-CM

## 2020-11-16 DIAGNOSIS — F330 Major depressive disorder, recurrent, mild: Secondary | ICD-10-CM

## 2020-12-14 ENCOUNTER — Telehealth (INDEPENDENT_AMBULATORY_CARE_PROVIDER_SITE_OTHER): Payer: No Payment, Other | Admitting: Psychiatry

## 2020-12-14 ENCOUNTER — Other Ambulatory Visit: Payer: Self-pay

## 2020-12-14 ENCOUNTER — Encounter (HOSPITAL_COMMUNITY): Payer: Self-pay | Admitting: Psychiatry

## 2020-12-14 DIAGNOSIS — F330 Major depressive disorder, recurrent, mild: Secondary | ICD-10-CM | POA: Diagnosis not present

## 2020-12-14 DIAGNOSIS — F411 Generalized anxiety disorder: Secondary | ICD-10-CM | POA: Diagnosis not present

## 2020-12-14 MED ORDER — FLUOXETINE HCL 40 MG PO CAPS: ORAL_CAPSULE | ORAL | 2 refills | Status: DC

## 2020-12-14 MED ORDER — GABAPENTIN 300 MG PO CAPS: 300.0000 mg | ORAL_CAPSULE | Freq: Three times a day (TID) | ORAL | 2 refills | Status: DC

## 2020-12-14 NOTE — Progress Notes (Signed)
BH MD/PA/NP OP Progress Note Virtual Visit via Video Note  I connected with Dwayne Cortez on 12/14/20 at  4:00 PM EDT by a video enabled telemedicine application and verified that I am speaking with the correct person using two identifiers.  Location: Patient: Home Provider: Clinic   I discussed the limitations of evaluation and management by telemedicine and the availability of in person appointments. The patient expressed understanding and agreed to proceed.  I provided 30 minutes of non-face-to-face time during this encounter.    12/14/2020 4:08 PM Dwayne Cortez  MRN:  [id number]  Chief Complaint: "I am heading to [place] for social work"  HPI: 50 year old male seen today for follow up psychiatric evaluation. 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  He is currently managed on Prozac 40 mg daily and gabapentin 300 mg three daily.  He notes that his mediations are effective in managing his psychiatric conditions.     Today patient is pleasant, well-groomed, engaged in conversation, maintained eye contact, and was cooperative.    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  He notes classes starts on August 22.  He notes that he is nervous about this transition but is excited as well.  Patient informed Clinical research associate that his anxiety and depression continues to be minimal.  Provider conducted a GAD-7 and patient scored a 5, his last visit he scored 8.  Provider also conducted a PHQ-9 and patient scored a 2, at his last visit he scored a 5.  He endorses adequate appetite. He denies any SI/HI/VAH, paranoia, or mania.    No medication changes made today.  Patient agreeable to continue medications as prescribed.  He will follow-up with outpatient counseling for therapy.  No other  concerns noted at this time. Visit Diagnosis:    ICD-10-CM   1. Generalized anxiety disorder  F41.1 FLUoxetine (PROZAC) 40 MG capsule    gabapentin (NEURONTIN) 300 MG capsule    2. 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 No pertinent family history.  Social History:  Social History   Socioeconomic History   Marital status: Single    Spouse name: Not on file   Number of children: Not on file   Years of education: Not on file   Highest education level: Not on file  Occupational History   Not on file  Tobacco Use   Smoking status: Every Day    Packs/day: 1.50    Types: Cigarettes   Smokeless tobacco: Never  Substance and Sexual Activity   Alcohol use: Yes    Comment: "Drinks as much as I can get my hands on." 10-12 drinks a day   Drug use: Yes    Types: Cocaine    Comment: hx of cocaine and heroin   Sexual activity: Not on file  Other Topics Concern  Not on file  Social History Narrative   Not on file   Social Determinants of Health   Financial Resource Strain: Not on file  Food Insecurity: Not on file  Transportation Needs: Not on file  Physical Activity: Not on file  Stress: Not on file  Social Connections: Not on file    Allergies: No Known Allergies  Metabolic Disorder Labs: No results found for: HGBA1C, MPG No results found for: PROLACTIN No results found for: CHOL, TRIG, HDL, CHOLHDL, VLDL, LDLCALC No results found for: TSH  Therapeutic Level  Labs: No results found for: LITHIUM No results found for: VALPROATE No components found for:  CBMZ  Current Medications: Current Outpatient Medications  Medication Sig Dispense Refill   cephALEXin (KEFLEX) 500 MG capsule Take 1 capsule (500 mg total) by mouth 4 (four) times daily. 20 capsule 0   doxycycline (VIBRAMYCIN) 100 MG capsule Take 1 capsule (100 mg total) by mouth 2 (two) times daily. 20 capsule 0   FLUoxetine (PROZAC) 40 MG capsule Take one tablet daily. 30 capsule 2   gabapentin (NEURONTIN) 300 MG capsule Take 1 capsule (300 mg total) by mouth 3 (three) times daily. 90 capsule 2   No current facility-administered medications for this visit.     Musculoskeletal: Strength & Muscle Tone:  Unable to assess due to telehealth visit Gait & Station:  Unable to assess due to telehealth visit Patient leans: N/A  Psychiatric Specialty Exam: Review of Systems  There were no vitals taken for this visit.There is no height or weight on file to calculate BMI.  General Appearance: Well Groomed  Eye Contact:  Good  Speech:  Clear and Coherent and Normal Rate  Volume:  Normal  Mood:  Euthymic  Affect:  Appropriate and Congruent  Thought Process:  Coherent, Goal Directed and Linear  Orientation:  Full (Time, Place, and Person)  Thought Content: WDL and Logical   Suicidal Thoughts:  No  Homicidal Thoughts:  No  Memory:  Immediate;   Good Recent;   Good Remote;   Good  Judgement:  Good  Insight:  Good  Psychomotor Activity:  Normal  Concentration:  Concentration: Good and Attention Span: Good  Recall:  Good  Fund of Knowledge: Good  Language: Good  Akathisia:  No  Handed:  Right  AIMS (if indicated): Not done  Assets:  Communication Skills Desire for Improvement Financial Resources/Insurance Housing Intimacy Social Support  ADL's:  Intact  Cognition: WNL  Sleep:  Good   Screenings: GAD-7    Flowsheet Row Video Visit from 12/14/2020 in [facility] Video Visit from 09/13/2020 in [facility] Video Visit from 06/16/2020 in [facility] Counselor from 03/15/2020 in [facility]  Total GAD-7 Score 5 8 6 13       PHQ2-9    Flowsheet Row Video Visit from 12/14/2020 in [facility] Video Visit from 09/13/2020 in [facility] Video Visit from 06/16/2020 in [facility] Counselor from 03/15/2020 in [facility]  PHQ-2 Total Score 0 1 3 2   PHQ-9 Total Score 2 5 11 8       Flowsheet Row Video Visit from 06/16/2020 in [facility]  C-SSRS RISK CATEGORY No Risk        Assessment and Plan: Patient notes that he is doing well on his current medication regimen.  No medication changes made today.  Patient agreeable to continue medication as prescribed. 1. Generalized anxiety disorder  Continue- FLUoxetine (PROZAC) 40 MG capsule; Take 1 capsule (40 mg total) by mouth daily.  Dispense: 30 capsule; Refill: 2 Continue- gabapentin (NEURONTIN) 300 MG capsule; Take 1 capsule (300 mg total) by mouth 3 (three) times daily.  Dispense: 90 capsule; Refill: 2  2. Mild episode of recurrent major depressive disorder (HCC)  Continue- FLUoxetine (PROZAC) 40 MG capsule; Take 1 capsule (40 mg total) by mouth daily.  Dispense: 30 capsule; Refill: 2  Follow-up in 3 months Follow-up with therapy  Shanna Cisco, NP 12/14/2020, 4:08 PM

## 2021-03-17 ENCOUNTER — Encounter (HOSPITAL_COMMUNITY): Payer: Self-pay | Admitting: Psychiatry

## 2021-03-17 ENCOUNTER — Telehealth (INDEPENDENT_AMBULATORY_CARE_PROVIDER_SITE_OTHER): Payer: BC Managed Care – PPO | Admitting: Psychiatry

## 2021-03-17 DIAGNOSIS — F411 Generalized anxiety disorder: Secondary | ICD-10-CM | POA: Diagnosis not present

## 2021-03-17 DIAGNOSIS — F330 Major depressive disorder, recurrent, mild: Secondary | ICD-10-CM | POA: Diagnosis not present

## 2021-03-17 MED ORDER — FLUOXETINE HCL 40 MG PO CAPS: ORAL_CAPSULE | ORAL | 3 refills | Status: DC

## 2021-03-17 MED ORDER — GABAPENTIN 300 MG PO CAPS: 300.0000 mg | ORAL_CAPSULE | Freq: Three times a day (TID) | ORAL | 3 refills | Status: DC

## 2021-03-17 NOTE — Progress Notes (Signed)
BH MD/PA/NP OP Progress Note Virtual Visit via Video Note  I connected with Dwayne Cortez on 03/17/21 at  9:00 AM EST by a video enabled telemedicine application and verified that I am speaking with the correct person using two identifiers.  Location: Patient: Home Provider: Clinic   I discussed the limitations of evaluation and management by telemedicine and the availability of in person appointments. The patient expressed understanding and agreed to proceed.  I provided 30 minutes of non-face-to-face time during this encounter.    03/17/2021 9:07 AM Dwayne Cortez  MRN:  [id number]  Chief Complaint: "I have been busy"  HPI: 50 year old male seen today for follow up psychiatric evaluation. 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  He is currently managed on Prozac 40 mg daily and gabapentin 300 mg three daily.  He notes that his mediations are effective in managing his psychiatric conditions.     Today patient is pleasant, well-groomed, engaged in conversation, maintained eye contact, and was cooperative.    He informed Clinical research associate that he has been busy with school at [employer] (studying social work).  He informed Clinical research associate that he is doing well in his classes and finds school enjoyable. Patient notes that he has minimal anxiety and depression.  Provider conducted a GAD-7 and patient scored a 6, his last visit he scored 5.  Provider also conducted a PHQ-9 and patient scored a 7, at his last visit he scored a 2.  He endorses adequate appetite. He denies any SI/HI/VAH, paranoia, or mania.    No medication changes made today.  Patient agreeable to continue medications as prescribed.  He will follow-up with outpatient counseling for therapy.  No other concerns noted at this time. Visit Diagnosis:    ICD-10-CM   1.  Generalized anxiety disorder  F41.1 FLUoxetine (PROZAC) 40 MG capsule    gabapentin (NEURONTIN) 300 MG capsule    2. 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 No pertinent family history.  Social History:  Social History   Socioeconomic History   Marital status: Single    Spouse name: Not on file   Number of children: Not on file   Years of education: Not on file   Highest education level: Not on file  Occupational History   Not on file  Tobacco Use   Smoking status: Every Day    Packs/day: 1.50    Types: Cigarettes   Smokeless tobacco: Never  Substance and Sexual Activity   Alcohol use: Yes    Comment: "Drinks as much as I can get my hands on." 10-12 drinks a day   Drug use: Yes    Types: Cocaine    Comment: hx of cocaine and heroin   Sexual activity: Not on file  Other Topics Concern   Not on file  Social History Narrative   Not on file  Social Determinants of Health   Financial Resource Strain: Not on file  Food Insecurity: Not on file  Transportation Needs: Not on file  Physical Activity: Not on file  Stress: Not on file  Social Connections: Not on file    Allergies: No Known Allergies  Metabolic Disorder Labs: No results found for: HGBA1C, MPG No results found for: PROLACTIN No results found for: CHOL, TRIG, HDL, CHOLHDL, VLDL, LDLCALC No results found for: TSH  Therapeutic Level Labs: No results found for: LITHIUM No results found for:  VALPROATE No components found for:  CBMZ  Current Medications: Current Outpatient Medications  Medication Sig Dispense Refill   cephALEXin (KEFLEX) 500 MG capsule Take 1 capsule (500 mg total) by mouth 4 (four) times daily. 20 capsule 0   doxycycline (VIBRAMYCIN) 100 MG capsule Take 1 capsule (100 mg total) by mouth 2 (two) times daily. 20 capsule 0   FLUoxetine (PROZAC) 40 MG capsule Take one tablet daily. 30 capsule 3   gabapentin (NEURONTIN) 300 MG capsule Take 1 capsule (300 mg total) by mouth 3 (three) times daily. 90 capsule 3   No current facility-administered medications for this visit.     Musculoskeletal: Strength & Muscle Tone:  Unable to assess due to telehealth visit Gait & Station:  Unable to assess due to telehealth visit Patient leans: N/A  Psychiatric Specialty Exam: Review of Systems  There were no vitals taken for this visit.There is no height or weight on file to calculate BMI.  General Appearance: Well Groomed  Eye Contact:  Good  Speech:  Clear and Coherent and Normal Rate  Volume:  Normal  Mood:  Euthymic  Affect:  Appropriate and Congruent  Thought Process:  Coherent, Goal Directed and Linear  Orientation:  Full (Time, Place, and Person)  Thought Content: WDL and Logical   Suicidal Thoughts:  No  Homicidal Thoughts:  No  Memory:  Immediate;   Good Recent;   Good Remote;   Good  Judgement:  Good  Insight:  Good  Psychomotor Activity:  Normal  Concentration:  Concentration: Good and Attention Span: Good  Recall:  Good  Fund of Knowledge: Good  Language: Good  Akathisia:  No  Handed:  Right  AIMS (if indicated): Not done  Assets:  Communication Skills Desire for Improvement Financial Resources/Insurance Housing Intimacy Social Support  ADL's:  Intact  Cognition: WNL  Sleep:  Good   Screenings: GAD-7    Flowsheet Row Video Visit from 03/17/2021 in [facility] Video Visit from 12/14/2020 in [facility] Video Visit from 09/13/2020 in [facility] Video Visit from 06/16/2020 in [facility] Counselor from 03/15/2020 in [facility]  Total GAD-7 Score 6 5 8 6 13       PHQ2-9    Flowsheet Row Video Visit from 03/17/2021 in [facility] Video Visit from 12/14/2020 in [facility] Video Visit from 09/13/2020 in [facility] Video Visit from 06/16/2020 in [facility] Counselor from 03/15/2020 in [facility]  PHQ-2 Total Score 2 0 1 3 2   PHQ-9 Total Score 7 2 5 11 8       Flowsheet Row Video Visit from 06/16/2020 in [facility]  C-SSRS RISK CATEGORY No Risk        Assessment and Plan: Patient notes that he is doing well on his current  medication regimen.  No medication changes made today.  Patient agreeable to continue medication as prescribed. 1. Generalized anxiety disorder  Continue- FLUoxetine (PROZAC) 40 MG capsule; Take 1 capsule (40 mg total) by mouth daily.  Dispense: 30 capsule; Refill: 3 Continue- gabapentin (NEURONTIN) 300 MG capsule; Take 1 capsule (300 mg total) by mouth 3 (three) times daily.  Dispense: 90 capsule; Refill: 3  2. Mild episode of recurrent major depressive disorder (HCC)  Continue- FLUoxetine (PROZAC) 40 MG capsule; Take 1 capsule (40 mg total) by mouth daily.  Dispense: 30 capsule; Refill: 3  Follow-up in 3 months Follow-up with therapy  Shanna Cisco, NP 03/17/2021, 9:07 AM

## 2021-03-25 ENCOUNTER — Ambulatory Visit
Admission: EM | Admit: 2021-03-25 | Discharge: 2021-03-25 | Disposition: A | Discharge status: Home / Self Care | Payer: BC Managed Care – PPO

## 2021-03-25 ENCOUNTER — Encounter (HOSPITAL_COMMUNITY): Payer: Self-pay

## 2021-03-25 ENCOUNTER — Encounter: Payer: Self-pay | Admitting: Emergency Medicine

## 2021-03-25 ENCOUNTER — Emergency Department (HOSPITAL_COMMUNITY): Payer: BC Managed Care – PPO

## 2021-03-25 ENCOUNTER — Other Ambulatory Visit: Payer: Self-pay

## 2021-03-25 ENCOUNTER — Emergency Department (HOSPITAL_COMMUNITY)
Admission: EM | Admit: 2021-03-25 | Discharge: 2021-03-25 | Disposition: A | Discharge status: Home / Self Care | Payer: BC Managed Care – PPO | Attending: Emergency Medicine | Admitting: Emergency Medicine

## 2021-03-25 DIAGNOSIS — I700 Atherosclerosis of aorta: Secondary | ICD-10-CM

## 2021-03-25 DIAGNOSIS — Y92410 Unspecified street and highway as the place of occurrence of the external cause: Secondary | ICD-10-CM | POA: Insufficient documentation

## 2021-03-25 DIAGNOSIS — R1031 Right lower quadrant pain: Secondary | ICD-10-CM | POA: Diagnosis not present

## 2021-03-25 DIAGNOSIS — R911 Solitary pulmonary nodule: Secondary | ICD-10-CM

## 2021-03-25 DIAGNOSIS — S27302A Unspecified injury of lung, bilateral, initial encounter: Secondary | ICD-10-CM | POA: Diagnosis not present

## 2021-03-25 DIAGNOSIS — J439 Emphysema, unspecified: Secondary | ICD-10-CM | POA: Diagnosis not present

## 2021-03-25 DIAGNOSIS — I10 Essential (primary) hypertension: Secondary | ICD-10-CM | POA: Insufficient documentation

## 2021-03-25 DIAGNOSIS — R0781 Pleurodynia: Secondary | ICD-10-CM

## 2021-03-25 DIAGNOSIS — F17210 Nicotine dependence, cigarettes, uncomplicated: Secondary | ICD-10-CM | POA: Diagnosis not present

## 2021-03-25 DIAGNOSIS — S27329A Contusion of lung, unspecified, initial encounter: Secondary | ICD-10-CM | POA: Diagnosis not present

## 2021-03-25 DIAGNOSIS — R072 Precordial pain: Secondary | ICD-10-CM | POA: Diagnosis not present

## 2021-03-25 DIAGNOSIS — S27322A Contusion of lung, bilateral, initial encounter: Secondary | ICD-10-CM | POA: Diagnosis not present

## 2021-03-25 DIAGNOSIS — M79602 Pain in left arm: Secondary | ICD-10-CM | POA: Diagnosis not present

## 2021-03-25 DIAGNOSIS — Z041 Encounter for examination and observation following transport accident: Secondary | ICD-10-CM | POA: Diagnosis not present

## 2021-03-25 LAB — CBC WITH DIFFERENTIAL/PLATELET
Abs Immature Granulocytes: 0.04 10*3/uL (ref 0.00–0.07)
Basophils Absolute: 0.1 10*3/uL (ref 0.0–0.1)
Basophils Relative: 1 %
Eosinophils Absolute: 0.4 10*3/uL (ref 0.0–0.5)
Eosinophils Relative: 3 %
HCT: 45.8 % (ref 39.0–52.0)
Hemoglobin: 15.6 g/dL (ref 13.0–17.0)
Immature Granulocytes: 0 %
Lymphocytes Relative: 22 %
Lymphs Abs: 2.7 10*3/uL (ref 0.7–4.0)
MCH: 31.2 pg (ref 26.0–34.0)
MCHC: 34.1 g/dL (ref 30.0–36.0)
MCV: 91.6 fL (ref 80.0–100.0)
Monocytes Absolute: 0.8 10*3/uL (ref 0.1–1.0)
Monocytes Relative: 7 %
Neutro Abs: 7.9 10*3/uL — ABNORMAL HIGH (ref 1.7–7.7)
Neutrophils Relative %: 67 %
Platelets: 319 10*3/uL (ref 150–400)
RBC: 5 MIL/uL (ref 4.22–5.81)
RDW: 13.2 % (ref 11.5–15.5)
WBC: 11.9 10*3/uL — ABNORMAL HIGH (ref 4.0–10.5)
nRBC: 0 % (ref 0.0–0.2)

## 2021-03-25 LAB — I-STAT CHEM 8, ED
BUN: 5 mg/dL — ABNORMAL LOW (ref 6–20)
Calcium, Ion: 1.23 mmol/L (ref 1.15–1.40)
Chloride: 103 mmol/L (ref 98–111)
Creatinine, Ser: 0.8 mg/dL (ref 0.61–1.24)
Glucose, Bld: 86 mg/dL (ref 70–99)
HCT: 47 % (ref 39.0–52.0)
Hemoglobin: 16 g/dL (ref 13.0–17.0)
Potassium: 4 mmol/L (ref 3.5–5.1)
Sodium: 140 mmol/L (ref 135–145)
TCO2: 27 mmol/L (ref 22–32)

## 2021-03-25 LAB — COMPREHENSIVE METABOLIC PANEL
ALT: 37 U/L (ref 0–44)
AST: 42 U/L — ABNORMAL HIGH (ref 15–41)
Albumin: 3.9 g/dL (ref 3.5–5.0)
Alkaline Phosphatase: 131 U/L — ABNORMAL HIGH (ref 38–126)
Anion gap: 7 (ref 5–15)
BUN: 6 mg/dL (ref 6–20)
CO2: 28 mmol/L (ref 22–32)
Calcium: 9.4 mg/dL (ref 8.9–10.3)
Chloride: 102 mmol/L (ref 98–111)
Creatinine, Ser: 0.77 mg/dL (ref 0.61–1.24)
GFR, Estimated: >60 mL/min (ref >60)
Glucose, Bld: 90 mg/dL (ref 70–99)
Potassium: 4 mmol/L (ref 3.5–5.1)
Sodium: 137 mmol/L (ref 135–145)
Total Bilirubin: 0.5 mg/dL (ref 0.3–1.2)
Total Protein: 7 g/dL (ref 6.5–8.1)

## 2021-03-25 MED ORDER — HYDROCODONE-ACETAMINOPHEN 5-325 MG PO TABS: 1.0000 | ORAL_TABLET | Freq: Four times a day (QID) | ORAL | 0 refills | Status: DC | PRN

## 2021-03-25 MED ORDER — IOHEXOL 300 MG/ML  SOLN
75.0000 mL | Freq: Once | INTRAMUSCULAR | Status: AC | PRN
  Administered 2021-03-25: 75 mL via INTRAVENOUS

## 2021-03-25 MED ORDER — METHOCARBAMOL 500 MG PO TABS: 500.0000 mg | ORAL_TABLET | Freq: Every evening | ORAL | 0 refills | Status: DC | PRN

## 2021-03-25 MED ORDER — MORPHINE SULFATE (PF) 4 MG/ML IV SOLN
4.0000 mg | Freq: Once | INTRAVENOUS | Status: AC
  Administered 2021-03-25: 4 mg via INTRAVENOUS
  Filled 2021-03-25: qty 1
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
## 2021-03-25 NOTE — ED Notes (Signed)
This RN informed patient about proper use of incentive spirometer. Pt verbalized understanding.
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
## 2021-03-25 NOTE — Discharge Instructions (Addendum)
Take ibuprofen 3 times a day with meals.  Do not take other anti-inflammatories at the same time (Advil, Motrin, naproxen, Aleve). You may supplement with Tylenol if you need further pain control. Use Norco as needed for severe breakthrough pain.  Have caution, this may make you tired or groggy.  Do not drive or operate heavy machinery while taking this medicine. Use Robaxin as needed for muscle stiffness or soreness.  Do not take with the Norco, as this could cause excessive sleepiness. Use ice packs or heating pads if this helps control your pain. You will likely have continued muscle stiffness and soreness over the next couple days.  Follow-up with primary care in 1 week if your symptoms are not improving. Use the incentive spirometer at least 10 times a day to prevent pneumonia.  Return to the emergency room if you develop fever, cough, severe headache, difficulty breathing, chest pain, abdominal pain, or any new, sudden, or concerning symptoms.   Call the number on the back of your insurance card to set up a follow-up appointment with a primary care doctor for further evaluation of the pulmonary nodule and the plaque buildup in your arteries.
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
## 2021-03-25 NOTE — Discharge Instructions (Signed)
Please go to the emergency department as soon as you leave urgent care for further evaluation and management. ?
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
## 2021-04-10 DIAGNOSIS — L821 Other seborrheic keratosis: Secondary | ICD-10-CM | POA: Diagnosis not present

## 2021-04-10 DIAGNOSIS — L570 Actinic keratosis: Secondary | ICD-10-CM | POA: Diagnosis not present

## 2021-04-10 DIAGNOSIS — C44319 Basal cell carcinoma of skin of other parts of face: Secondary | ICD-10-CM | POA: Diagnosis not present

## 2021-04-10 DIAGNOSIS — C44311 Basal cell carcinoma of skin of nose: Secondary | ICD-10-CM | POA: Diagnosis not present

## 2021-04-10 DIAGNOSIS — L578 Other skin changes due to chronic exposure to nonionizing radiation: Secondary | ICD-10-CM | POA: Diagnosis not present

## 2021-05-25 DIAGNOSIS — I2510 Atherosclerotic heart disease of native coronary artery without angina pectoris: Secondary | ICD-10-CM | POA: Insufficient documentation

## 2021-05-25 DIAGNOSIS — I700 Atherosclerosis of aorta: Secondary | ICD-10-CM | POA: Diagnosis not present

## 2021-05-25 DIAGNOSIS — I4510 Unspecified right bundle-branch block: Secondary | ICD-10-CM | POA: Diagnosis not present

## 2021-05-25 DIAGNOSIS — Z720 Tobacco use: Secondary | ICD-10-CM | POA: Diagnosis not present

## 2021-05-26 DIAGNOSIS — I4510 Unspecified right bundle-branch block: Secondary | ICD-10-CM | POA: Diagnosis not present

## 2021-05-26 DIAGNOSIS — I700 Atherosclerosis of aorta: Secondary | ICD-10-CM | POA: Diagnosis not present

## 2021-06-09 ENCOUNTER — Encounter (HOSPITAL_COMMUNITY): Payer: Self-pay

## 2021-06-09 ENCOUNTER — Telehealth (HOSPITAL_COMMUNITY): Payer: BC Managed Care – PPO | Admitting: Psychiatry

## 2021-06-28 DIAGNOSIS — I2510 Atherosclerotic heart disease of native coronary artery without angina pectoris: Secondary | ICD-10-CM | POA: Diagnosis not present

## 2021-06-28 DIAGNOSIS — R748 Abnormal levels of other serum enzymes: Secondary | ICD-10-CM | POA: Diagnosis not present

## 2021-07-24 ENCOUNTER — Telehealth (INDEPENDENT_AMBULATORY_CARE_PROVIDER_SITE_OTHER): Payer: BC Managed Care – PPO | Admitting: Psychiatry

## 2021-07-24 ENCOUNTER — Other Ambulatory Visit: Payer: Self-pay

## 2021-07-24 DIAGNOSIS — F411 Generalized anxiety disorder: Secondary | ICD-10-CM

## 2021-07-24 DIAGNOSIS — F330 Major depressive disorder, recurrent, mild: Secondary | ICD-10-CM | POA: Diagnosis not present

## 2021-07-24 MED ORDER — GABAPENTIN 300 MG PO CAPS: 300.0000 mg | ORAL_CAPSULE | Freq: Three times a day (TID) | ORAL | 2 refills | Status: DC

## 2021-07-24 MED ORDER — FLUOXETINE HCL 40 MG PO CAPS: ORAL_CAPSULE | ORAL | 2 refills | Status: DC

## 2021-07-24 NOTE — Progress Notes (Signed)
BH MD/PA/NP OP Progress Note ? ?07/24/2021 4:57 PM ?Dwayne EeBarry A Amesquita  ?MRN:  [id number] ? ?Virtual Visit via Video Note ? ?I connected with Dwayne Cortez on 07/24/21 at  5:00 PM EDT by a video enabled telemedicine application and verified that I am speaking with the correct person using two identifiers. ? ?Location: ?Patient: car ?Provider: off site ?  ?I discussed the limitations of evaluation and management by telemedicine and the availability of in person appointments. The patient expressed understanding and agreed to proceed. ?  ?I discussed the assessment and treatment plan with the patient. The patient was provided an opportunity to ask questions and all were answered. The patient agreed with the plan and demonstrated an understanding of the instructions. ?  ?The patient was advised to call back or seek an in-person evaluation if the symptoms worsen or if the condition fails to improve as anticipated. ? ?I provided 5 minutes of non-face-to-face time during this encounter. ? ? ?Mcneil Sobericely Charlita Brian, NP  ? ?Chief Complaint: Medication management ? ?HPI: Dwayne Cortez is a 51 year old [facility] follow-up psychiatric evaluation.  He has a psychiatric history of generalized anxiety disorder and major depressive disorder.  His symptoms are managed with fluoxetine 40 mg daily and gabapentin 300 mg 3 times a day.  Patient reports medication compliance and denies adverse reactions.  Patient reports that medications are effective with managing his symptoms and denies the need for dosage adjustment today.  No medication changes today. ?Patient is alert and oriented x4, calm, pleasant and willing to engage.  She reports a good mood, appetite and sleep cycle.  Patient denies SI, HI, AVH, paranoia, or delusions. ? ?Visit Diagnosis:  ?  ICD-10-CM   ?1. Generalized anxiety disorder  F41.1 FLUoxetine (PROZAC) 40 MG capsule  ?  gabapentin (NEURONTIN) 300 MG capsule  ?  ?2. Mild  episode of recurrent major depressive disorder (HCC)  F33.0 FLUoxetine (PROZAC) 40 MG capsule  ?  ? ? ?Past Psychiatric History: Generalized anxiety disorder major depressive disorder ? ?Past Medical History:  ?Past Medical History:  ?Diagnosis Date  ? Alcohol abuse   ? Hypertension   ?  ?Past Surgical History:  ?Procedure Laterality Date  ? KNEE SURGERY    ? ? ?Family Psychiatric History: None known ? ?Family History: No family history on file. ? ?Social History:  ?Social History  ? ?Socioeconomic History  ? Marital status: Single  ?  Spouse name: Not on file  ? Number of children: Not on file  ? Years of education: Not on file  ? Highest education level: Not on file  ?Occupational History  ? Not on file  ?Tobacco Use  ? Smoking status: Every Day  ?  Packs/day: 1.50  ?  Types: Cigarettes  ? Smokeless tobacco: Never  ?Substance and Sexual Activity  ? Alcohol use: Yes  ?  Comment: "Drinks as much as I can get my hands on." 10-12 drinks a day  ? Drug use: Yes  ?  Types: Cocaine  ?  Comment: hx of cocaine and heroin  ? Sexual activity: Not on file  ?Other Topics Concern  ? Not on file  ?Social History Narrative  ? Not on file  ? ?Social Determinants of Health  ? ?Financial Resource Strain: Not on file  ?Food Insecurity: Not on file  ?Transportation Needs: Not on file  ?Physical Activity: Not on file  ?Stress: Not on file  ?Social Connections: Not on file  ? ? ?  Allergies: No Known Allergies ? ?Metabolic Disorder Labs: ?No results found for: HGBA1C, MPG ?No results found for: PROLACTIN ?No results found for: CHOL, TRIG, HDL, CHOLHDL, VLDL, LDLCALC ?No results found for: TSH ? ?Therapeutic Level Labs: ?No results found for: LITHIUM ?No results found for: VALPROATE ?No components found for:  CBMZ ? ?Current Medications: ?Current Outpatient Medications  ?Medication Sig Dispense Refill  ? cephALEXin (KEFLEX) 500 MG capsule Take 1 capsule (500 mg total) by mouth 4 (four) times daily. 20 capsule 0  ? doxycycline (VIBRAMYCIN)  100 MG capsule Take 1 capsule (100 mg total) by mouth 2 (two) times daily. 20 capsule 0  ? FLUoxetine (PROZAC) 40 MG capsule Take one tablet daily. 30 capsule 2  ? gabapentin (NEURONTIN) 300 MG capsule Take 1 capsule (300 mg total) by mouth 3 (three) times daily. 90 capsule 2  ? HYDROcodone-acetaminophen (NORCO/VICODIN) 5-325 MG tablet Take 1 tablet by mouth every 6 (six) hours as needed. 8 tablet 0  ? methocarbamol (ROBAXIN) 500 MG tablet Take 1 tablet (500 mg total) by mouth at bedtime as needed for muscle spasms. 14 tablet 0  ? ?No current facility-administered medications for this visit.  ? ? ? ?Musculoskeletal: ?Strength & Muscle Tone:  N/A virtual visit ?Gait & Station:  N/A virtual visit ?Patient leans: N/A ? ?Psychiatric Specialty Exam: ?Review of Systems  ?Psychiatric/Behavioral:  Negative for hallucinations, self-injury and suicidal ideas.   ?All other systems reviewed and are negative.  ?There were no vitals taken for this visit.There is no height or weight on file to calculate BMI.  ?General Appearance: Well-groomed  ?Eye Contact: Good  ?Speech: Normal clear and coherent  ?Volume: Normal  ?Mood:  Euthymic  ?Affect:  Congruent  ?Thought Process:  Goal Directed  ?Orientation:  Full (Time, Place, and Person)  ?Thought Content: Logical   ?Suicidal Thoughts:  No  ?Homicidal Thoughts:  No  ?Memory: Good  ?Judgement: Good  ?Insight: Good  ?Psychomotor Activity: N/A  ?Concentration: Good  ?Recall: Good  ?Fund of Knowledge: Good  ?Language: Good  ?Akathisia: N/A  ?Handed:  Right  ?AIMS (if indicated): not done  ?Assets:  Communication Skills ?Desire for Improvement  ?ADL's:  Intact  ?Cognition: WNL  ?Sleep:  Good  ? ?Screenings: ?GAD-7   ? ?Flowsheet Row Video Visit from 03/17/2021 in [facility] Video Visit from 12/14/2020 in [facility] Video Visit from 09/13/2020 in [facility] Video Visit from 06/16/2020 in [facility] Counselor from 03/15/2020 in [facility]  ?Total GAD-7 Score 6 5 8 6 13   ? ?  ? ?PHQ2-9   ? ?Flowsheet Row Video Visit from 03/17/2021 in [facility] Video Visit from 12/14/2020 in [facility] Video Visit from 09/13/2020 in [facility] Video Visit from 06/16/2020 in [facility] Counselor from 03/15/2020 in [facility]  ?PHQ-2 Total Score 2 0 1 3 2   ?PHQ-9 Total Score 7 2 5 11 8   ? ?  ? ?Flowsheet Row ED from 03/25/2021 in [facility] EMERGENCY DEPARTMENT ?Most recent reading at 03/25/2021 12:35 PM ED from 03/25/2021 in [facility]  ?Most recent reading at 03/25/2021 10:31 AM Video Visit from 06/16/2020 in [facility] ?Most recent reading at 06/16/2020  1:13 PM  ?C-SSRS RISK CATEGORY No Risk No Risk No Risk  ? ?  ? ? ? ?  Assessment and Plan: Dwayne Cortez is a 51 year old male presenting to [facility] behavioral health outpatient for follow-up psychiatric evaluation.  He has a psychiatric history of generalized anxiety disorder and major depressive disorder.  His symptoms are managed with fluoxetine 40 mg daily and gabapentin 300 mg 3 times a day.  Patient reports medication compliance and denies adverse reactions.  Patient reports that medications are effective with managing his symptoms and denies the need for dosage adjustment today.  No medication changes today.  Medications refilled at current dosages. ? ?Collaboration of Care: Collaboration of Care: Medication Management AEB medications E scribed to patient's preferred pharmacy. ? ?1. Generalized anxiety disorder ? ?- FLUoxetine (PROZAC) 40 MG capsule; Take one tablet daily.  Dispense: 30 capsule; Refill: 2 ?- gabapentin (NEURONTIN) 300 MG capsule; Take 1 capsule (300 mg total) by mouth 3  (three) times daily.  Dispense: 90 capsule; Refill: 2 ? ?2. Mild episode of recurrent major depressive disorder (HCC) ? ?- FLUoxetine (PROZAC) 40 MG capsule; Take one tablet daily.  Dispense: 30 capsule; Refill:

## 2021-10-06 ENCOUNTER — Other Ambulatory Visit (HOSPITAL_COMMUNITY): Payer: Self-pay | Admitting: Psychiatry

## 2021-10-06 DIAGNOSIS — F411 Generalized anxiety disorder: Secondary | ICD-10-CM

## 2021-10-25 ENCOUNTER — Telehealth (INDEPENDENT_AMBULATORY_CARE_PROVIDER_SITE_OTHER): Payer: BC Managed Care – PPO | Admitting: Psychiatry

## 2021-10-25 ENCOUNTER — Encounter (HOSPITAL_COMMUNITY): Payer: Self-pay | Admitting: Psychiatry

## 2021-10-25 DIAGNOSIS — F411 Generalized anxiety disorder: Secondary | ICD-10-CM

## 2021-10-25 DIAGNOSIS — F330 Major depressive disorder, recurrent, mild: Secondary | ICD-10-CM | POA: Diagnosis not present

## 2021-10-25 MED ORDER — FLUOXETINE HCL 40 MG PO CAPS: ORAL_CAPSULE | ORAL | 3 refills | Status: DC

## 2021-10-25 MED ORDER — GABAPENTIN 300 MG PO CAPS: ORAL_CAPSULE | ORAL | 3 refills | Status: DC

## 2021-10-25 NOTE — Progress Notes (Signed)
BH MD/PA/NP OP Progress Note Virtual Visit via Video Note  I connected with Dwayne Cortez on 10/25/21 at  4:00 PM EDT by a video enabled telemedicine application and verified that I am speaking with the correct person using two identifiers.  Location: Patient: Home Provider: Clinic   I discussed the limitations of evaluation and management by telemedicine and the availability of in person appointments. The patient expressed understanding and agreed to proceed.  I provided 30 minutes of non-face-to-face time during this encounter.      10/25/2021 4:01 PM Dwayne Cortez  MRN:  [id number]  Chief Complaint: "I have been busy"  HPI: 51 year old male seen today for follow up psychiatric evaluation. 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  He is currently managed on Prozac 40 mg daily and gabapentin 300 mg three daily.  He notes that his mediations are effective in managing his psychiatric conditions.     Today patient was unable to login virtually so his assessment is done over the phone.  During exam he was pleasant, cooperative, and engaged in conversation.  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  Patient informed writer that his mood is stable and notes that he has minimal anxiety and depression.  Provider conducted a GAD-7 and patient scored a 4.  Provider also conducted PHQ-9 and patient scored a 4.  He endorses adequate sleep and appetite.  Today he denies SI/HI/VAH, mania, paranoia.     No medication changes made today.  Patient agreeable to continue medications as prescribed.  He will follow-up with outpatient counseling for therapy.  No other concerns noted at this time. Visit Diagnosis:     ICD-10-CM   1. Generalized anxiety disorder  F41.1 FLUoxetine (PROZAC) 40 MG capsule    gabapentin (NEURONTIN) 300 MG capsule    2. 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 No pertinent family history.  Social History:  Social History   Socioeconomic History   Marital status: Single    Spouse name: Not on file   Number of children: Not on file   Years of education: Not on file   Highest education level: Not on file  Occupational History   Not on file  Tobacco Use   Smoking status: Every Day    Packs/day: 1.50    Types: Cigarettes   Smokeless tobacco: Never  Substance and Sexual Activity   Alcohol use: Yes    Comment: "Drinks as much as I can get my hands on." 10-12 drinks a day   Drug use: Yes    Types: Cocaine    Comment: hx of cocaine and heroin   Sexual activity: Not on file  Other Topics Concern   Not on file  Social History  Narrative   Not on file   Social Determinants of Health   Financial Resource Strain: Not on file  Food Insecurity: Not on file  Transportation Needs: Not on file  Physical Activity: Not on file  Stress: Not on file  Social Connections: Not on file    Allergies: No Known Allergies  Metabolic Disorder Labs: No results found for: "HGBA1C", "MPG" No results found for: "PROLACTIN" No results found for: "CHOL", "TRIG", "HDL", "CHOLHDL", "VLDL", "LDLCALC" No results found for: "TSH"  Therapeutic Level Labs: No results found  for: "LITHIUM" No results found for: "VALPROATE" Lab Results  Component Value Date   CBMZ 7.8 10/17/2011    Current Medications: Current Outpatient Medications  Medication Sig Dispense Refill   cephALEXin (KEFLEX) 500 MG capsule Take 1 capsule (500 mg total) by mouth 4 (four) times daily. 20 capsule 0   doxycycline (VIBRAMYCIN) 100 MG capsule Take 1 capsule (100 mg total) by mouth 2 (two) times daily. 20 capsule 0   FLUoxetine (PROZAC) 40 MG capsule Take one tablet daily. 30 capsule 3   gabapentin (NEURONTIN) 300 MG capsule TAKE 1 CAPSULE(300 MG) BY MOUTH THREE TIMES DAILY 90 capsule 3   HYDROcodone-acetaminophen (NORCO/VICODIN) 5-325 MG tablet Take 1 tablet by mouth every 6 (six) hours as needed. 8 tablet 0   methocarbamol (ROBAXIN) 500 MG tablet Take 1 tablet (500 mg total) by mouth at bedtime as needed for muscle spasms. 14 tablet 0   No current facility-administered medications for this visit.     Musculoskeletal: Strength & Muscle Tone:  Unable to assess due to telephone visit Gait & Station:  Unable to assess due to telephonevisit Patient leans: N/A  Psychiatric Specialty Exam: Review of Systems  There were no vitals taken for this visit.There is no height or weight on file to calculate BMI.  General Appearance:  Unable to assess due to telephone visit  Eye Contact:   Unable to assess due to telephone visit  Speech:  Clear and Coherent and Normal Rate  Volume:  Normal  Mood:  Euthymic  Affect:  Appropriate and Congruent  Thought Process:  Coherent, Goal Directed and Linear  Orientation:  Full (Time, Place, and Person)  Thought Content: WDL and Logical   Suicidal Thoughts:  No  Homicidal Thoughts:  No  Memory:  Immediate;   Good Recent;   Good Remote;   Good  Judgement:  Good  Insight:  Good  Psychomotor Activity:   Unable to assess due to telephone visit  Concentration:  Concentration: Good and Attention Span: Good  Recall:  Good  Fund of Knowledge: Good   Language: Good  Akathisia:   Unable to assess due to telephone visit  Handed:  Right  AIMS (if indicated): Not done  Assets:  Communication Skills Desire for Improvement Financial Resources/Insurance Housing Intimacy Social Support  ADL's:  Intact  Cognition: WNL  Sleep:  Good   Screenings: GAD-7    Flowsheet Row Video Visit from 10/25/2021 in [facility] Video Visit from 03/17/2021 in [facility] Video Visit from 12/14/2020 in [facility] Video Visit from 09/13/2020 in [facility] Video Visit from 06/16/2020 in [facility]  Total GAD-7 Score 4 6 5 8 6       PHQ2-9    Flowsheet Row Video Visit from 10/25/2021 in [facility] Video Visit from 03/17/2021 in [facility] Video Visit from 12/14/2020 in  [facility] Video Visit from 09/13/2020 in [facility] Video Visit from 06/16/2020 in [facility]  PHQ-2 Total Score 2 2 0 1 3  PHQ-9 Total Score 4 7 2 5 11       Flowsheet Row ED from 03/25/2021 in [facility] EMERGENCY DEPARTMENT Most recent reading at 03/25/2021 12:35 PM ED from 03/25/2021 in [facility] at [facility]  Most recent reading at 03/25/2021 10:31 AM Video Visit from 06/16/2020 in [facility] Most recent reading at 06/16/2020  1:13 PM  C-SSRS RISK CATEGORY No Risk No Risk No Risk        Assessment and Plan: Patient notes that he is doing well on his current medication regimen.  No medication changes made today.  Patient agreeable to continue medication as prescribed. 1. Generalized anxiety disorder  Continue- FLUoxetine (PROZAC) 40 MG capsule; Take 1 capsule (40 mg total) by mouth daily.  Dispense: 30 capsule; Refill: 3 Continue-  gabapentin (NEURONTIN) 300 MG capsule; Take 1 capsule (300 mg total) by mouth 3 (three) times daily.  Dispense: 90 capsule; Refill: 3  2. Mild episode of recurrent major depressive disorder (HCC)  Continue- FLUoxetine (PROZAC) 40 MG capsule; Take 1 capsule (40 mg total) by mouth daily.  Dispense: 30 capsule; Refill: 3  Follow-up in 3 months Follow-up with therapy  08/14/2020, NP 10/25/2021, 4:01 PM

## 2021-10-31 ENCOUNTER — Ambulatory Visit (INDEPENDENT_AMBULATORY_CARE_PROVIDER_SITE_OTHER): Payer: BC Managed Care – PPO

## 2021-10-31 ENCOUNTER — Ambulatory Visit
Admission: EM | Admit: 2021-10-31 | Discharge: 2021-10-31 | Disposition: A | Discharge status: Home / Self Care | Payer: BC Managed Care – PPO | Attending: Internal Medicine | Admitting: Internal Medicine

## 2021-10-31 DIAGNOSIS — J069 Acute upper respiratory infection, unspecified: Secondary | ICD-10-CM | POA: Diagnosis not present

## 2021-10-31 DIAGNOSIS — S59902A Unspecified injury of left elbow, initial encounter: Secondary | ICD-10-CM | POA: Diagnosis not present

## 2021-10-31 DIAGNOSIS — M25522 Pain in left elbow: Secondary | ICD-10-CM

## 2021-10-31 DIAGNOSIS — J029 Acute pharyngitis, unspecified: Secondary | ICD-10-CM | POA: Diagnosis not present

## 2021-10-31 DIAGNOSIS — M19022 Primary osteoarthritis, left elbow: Secondary | ICD-10-CM | POA: Diagnosis not present

## 2021-10-31 LAB — POCT RAPID STREP A (OFFICE): Rapid Strep A Screen: NEGATIVE

## 2021-10-31 NOTE — ED Triage Notes (Signed)
Pt c/o cough, sore throat, and nasal congestion since yesterday. Sent home from work and will need a note.  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

## 2021-11-03 LAB — CULTURE, GROUP A STREP (THRC)

## 2022-01-24 ENCOUNTER — Telehealth (HOSPITAL_COMMUNITY): Payer: BC Managed Care – PPO | Admitting: Psychiatry

## 2022-01-24 ENCOUNTER — Encounter (HOSPITAL_COMMUNITY): Payer: Self-pay

## 2022-05-28 ENCOUNTER — Telehealth: Payer: Self-pay

## 2022-05-28 NOTE — Telephone Encounter (Signed)
Sending mychart msg. AS, CMA 

## 2022-07-02 DIAGNOSIS — Z79899 Other long term (current) drug therapy: Secondary | ICD-10-CM | POA: Diagnosis not present

## 2022-07-02 DIAGNOSIS — E559 Vitamin D deficiency, unspecified: Secondary | ICD-10-CM | POA: Diagnosis not present

## 2022-07-02 DIAGNOSIS — R079 Chest pain, unspecified: Secondary | ICD-10-CM | POA: Diagnosis not present

## 2022-07-02 DIAGNOSIS — R748 Abnormal levels of other serum enzymes: Secondary | ICD-10-CM | POA: Diagnosis not present

## 2022-07-02 DIAGNOSIS — K625 Hemorrhage of anus and rectum: Secondary | ICD-10-CM | POA: Diagnosis not present

## 2022-08-27 ENCOUNTER — Other Ambulatory Visit (HOSPITAL_COMMUNITY): Payer: Self-pay | Admitting: Psychiatry

## 2022-08-27 DIAGNOSIS — F411 Generalized anxiety disorder: Secondary | ICD-10-CM

## 2022-08-27 DIAGNOSIS — F330 Major depressive disorder, recurrent, mild: Secondary | ICD-10-CM

## 2022-09-01 ENCOUNTER — Other Ambulatory Visit (HOSPITAL_COMMUNITY): Payer: Self-pay | Admitting: Psychiatry

## 2022-09-01 DIAGNOSIS — F411 Generalized anxiety disorder: Secondary | ICD-10-CM

## 2022-09-01 DIAGNOSIS — F330 Major depressive disorder, recurrent, mild: Secondary | ICD-10-CM

## 2022-09-06 ENCOUNTER — Encounter (HOSPITAL_COMMUNITY): Payer: Self-pay | Admitting: Psychiatry

## 2022-09-06 ENCOUNTER — Telehealth (INDEPENDENT_AMBULATORY_CARE_PROVIDER_SITE_OTHER): Payer: BC Managed Care – PPO | Admitting: Psychiatry

## 2022-09-06 DIAGNOSIS — F411 Generalized anxiety disorder: Secondary | ICD-10-CM

## 2022-09-06 DIAGNOSIS — F331 Major depressive disorder, recurrent, moderate: Secondary | ICD-10-CM

## 2022-09-06 MED ORDER — HYDROXYZINE HCL 10 MG PO TABS: 10.0000 mg | ORAL_TABLET | Freq: Three times a day (TID) | ORAL | 3 refills | Status: DC | PRN

## 2022-09-06 MED ORDER — GABAPENTIN 300 MG PO CAPS: ORAL_CAPSULE | ORAL | 3 refills | Status: DC

## 2022-09-06 MED ORDER — FLUOXETINE HCL 20 MG PO CAPS: 60.0000 mg | ORAL_CAPSULE | Freq: Every day | ORAL | 3 refills | Status: DC

## 2022-09-06 NOTE — Progress Notes (Signed)
BH MD/PA/NP OP Progress Note Virtual Visit via Video Note  I connected with Dwayne Cortez on 09/06/22 at  3:00 PM EDT by a video enabled telemedicine application and verified that I am speaking with the correct person using two identifiers.  Location: Patient: Home Provider: Clinic   I discussed the limitations of evaluation and management by telemedicine and the availability of in person appointments. The patient expressed understanding and agreed to proceed.  I provided 30 minutes of non-face-to-face time during this encounter.      09/06/2022 3:31 PM Dwayne Cortez  MRN:  [id number]  Chief Complaint: "I been struggling a little"  HPI: 52 year old male seen today for follow up psychiatric evaluation. Patient came to the clinic to reestablish care as he has not been seen in over 6 months. 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  He is currently managed on Prozac 40 mg daily and gabapentin 300 mg three daily.  He notes that his mediations are somewhat effective in managing his psychiatric conditions.     Today patient was well groomed, pleasant, cooperative,  engaged in conversation and maintained eye contact.  He informed Clinical research associate that he has been struggling.  He notes that he is having family discord.  He also informed Clinical research associate that recently he quit his job at [employer] ending homelessness because he could not get along with one of his friends who was also a Human resources officer.  Patient notes that he and his girlfriend live in different cities and this is stressful.  He also reports that school has been hectic.  Patient continues to study social work at [employer].  Today provider conducted GAD-7 and patient scored a 13.  Provider also conducted PHQ-9 and patient scored an 18.  He endorses adequate sleep and appetite.  Today  he denies SI/HI/VAH, mania, paranoia.    Patient informed Clinical research associate that he has been rationing his gabapentin and Prozac.  Today he is agreeable to increasing Prozac 40 mg to 60 mg to help manage anxiety and depression.  He will also start hydroxyzine 10 mg 3 times daily as needed to help manage anxiety.  Potential side effects of medication and risks vs benefits of treatment vs non-treatment were explained and discussed. All questions were answered.He will continue gabapentin as prescribed.  No other concerns at this time.    Visit Diagnosis:    ICD-10-CM   1. Major depressive disorder, recurrent episode, moderate (HCC)  F33.1 FLUoxetine (PROZAC) 20 MG capsule    2. 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 No pertinent family history.  Social History:  Social History   Socioeconomic History   Marital status: Single    Spouse name: Not on file   Number of children: Not on file   Years of education: Not on file   Highest education level: Not on file  Occupational History   Not on file  Tobacco Use   Smoking status: Every Day    Packs/day: 1.5    Types: Cigarettes   Smokeless tobacco: Never  Substance and Sexual Activity   Alcohol use: Yes    Comment: occ   Drug use: Not Currently    Types: Cocaine    Comment: hx of cocaine and heroin   Sexual activity: Not on file  Other Topics Concern   Not on file  Social History Narrative   Not on file   Social  Determinants of Health   Financial Resource Strain: Not on file  Food Insecurity: Not on file  Transportation Needs: Not on file  Physical Activity: Not on file  Stress: Not on file  Social Connections: Not on file    Allergies: No Known Allergies  Metabolic Disorder Labs: No results found for: "HGBA1C", "MPG" No results found for: "PROLACTIN" No results found for: "CHOL", "TRIG", "HDL", "CHOLHDL", "VLDL", "LDLCALC" No results found for: "TSH"  Therapeutic Level Labs: No results found for: "LITHIUM" No results found for: "VALPROATE" Lab Results  Component Value Date   CBMZ 7.8 10/17/2011    Current Medications: Current Outpatient Medications  Medication Sig Dispense Refill   FLUoxetine (PROZAC) 20 MG capsule Take 3 capsules (60 mg total) by mouth daily. 90 capsule 3   hydrOXYzine (ATARAX) 10 MG tablet Take 1 tablet (10 mg total) by mouth 3 (three) times daily as needed. 90 tablet 3   gabapentin (NEURONTIN) 300 MG capsule TAKE 1 CAPSULE(300 MG) BY MOUTH THREE TIMES DAILY 90 capsule 3   rosuvastatin (CRESTOR) 20 MG tablet Take 20 mg by mouth at bedtime.     No current facility-administered medications for this visit.     Musculoskeletal: Strength & Muscle Tone: within normal limits and telehealth visit Gait & Station: normal, telehealth visit Patient leans: N/A  Psychiatric Specialty Exam: Review of Systems  There were no vitals taken for this visit.There is no height or weight on file to calculate BMI.  General Appearance: Well Groomed  Eye Contact:  Good  Speech:  Clear and Coherent and Normal Rate  Volume:  Normal  Mood:  Euthymic  Affect:  Appropriate and Congruent  Thought Process:  Coherent, Goal Directed and Linear  Orientation:  Full (Time, Place, and Person)  Thought Content: WDL and Logical   Suicidal Thoughts:  No  Homicidal Thoughts:  No  Memory:  Immediate;   Good Recent;   Good Remote;   Good  Judgement:  Good  Insight:  Good  Psychomotor  Activity:  Normal  Concentration:  Concentration: Good and Attention Span: Good  Recall:  Good  Fund of Knowledge: Good  Language: Good  Akathisia:  No  Handed:  Right  AIMS (if indicated): Not done  Assets:  Communication Skills Desire for Improvement Financial Resources/Insurance Housing Intimacy Social Support  ADL's:  Intact  Cognition: WNL  Sleep:  Good   Screenings: GAD-7    Flowsheet Row Video Visit from 09/06/2022 in [facility] Video Visit from 10/25/2021 in [facility] Video Visit from 03/17/2021 in [facility] Video Visit from 12/14/2020 in [facility] Video Visit from 09/13/2020 in [facility]  Total GAD-7 Score 13 4 6 5 8       PHQ2-9    Flowsheet Row Video Visit from 09/06/2022 in [facility] Video Visit from 10/25/2021 in [facility] Video Visit from 03/17/2021 in [facility] Video Visit from 12/14/2020 in [facility] Video Visit from 09/13/2020 in [facility]  PHQ-2 Total Score 4 2 2  0 1  PHQ-9 Total Score 18 4 7 2 5       Flowsheet Row ED from 10/31/2021 in [facility]) Most recent reading at 10/31/2021 12:20 PM ED from 03/25/2021 in [facility] Emergency Department at [facility] Most recent reading at 03/25/2021 12:35 PM ED from 03/25/2021 in [facility] at [facility]) Most recent reading at 03/25/2021 10:31 AM  C-SSRS RISK CATEGORY No Risk No Risk No Risk        Assessment and Plan: Patient endorses symptoms of anxiety and depression. Patient informed Clinical research associate that he has been rationing his gabapentin and Prozac.  Today he is agreeable to increasing Prozac 40 mg to 60 mg to help manage anxiety and  depression.  He will also start hydroxyzine 10 mg 3 times daily as needed to help manage anxiety.  He will continue gabapentin as prescribed.  1. Generalized anxiety disorder  Continue- gabapentin (NEURONTIN) 300 MG capsule; TAKE 1 CAPSULE(300 MG) BY MOUTH THREE TIMES DAILY  Dispense: 90 capsule; Refill: 3 Start- hydrOXYzine (ATARAX) 10 MG tablet; Take 1 tablet (10 mg total) by mouth 3 (three) times daily as needed.  Dispense: 90 tablet; Refill: 3  2. Major depressive disorder, recurrent episode, moderate (HCC)  Increased- FLUoxetine (PROZAC) 20 MG capsule; Take 3 capsules (60 mg total) by mouth daily.  Dispense: 90 capsule; Refill: 3  Follow-up in 2.5 months Follow-up with therapy  Shanna Cisco, NP 09/06/2022, 3:31 PM

## 2022-09-11 ENCOUNTER — Telehealth (HOSPITAL_COMMUNITY): Payer: Self-pay | Admitting: *Deleted

## 2022-09-11 ENCOUNTER — Telehealth (HOSPITAL_COMMUNITY): Payer: Self-pay | Admitting: Psychiatry

## 2022-09-11 NOTE — Telephone Encounter (Signed)
Writer returned pt call regarding refills however pt VM is full so unable to LVM for pt. Pt is seen at the [facility] office.

## 2022-09-12 NOTE — Telephone Encounter (Signed)
Provider called patient and informed him that he is to take Prozac 60 mg daily.  Patient will have to take three 20 mg tablets daily.  He endorsed understanding and agreed.  No other concerns at this time.

## 2022-09-12 NOTE — Telephone Encounter (Signed)
Provider called patient and informed him that he is to take Prozac 60 mg daily.  Patient will have to take three 20 mg tablets daily.  He endorsed understanding and agreed.  No other concerns at this time.

## 2022-10-23 ENCOUNTER — Encounter (HOSPITAL_COMMUNITY): Payer: Self-pay

## 2022-10-23 ENCOUNTER — Ambulatory Visit (HOSPITAL_COMMUNITY): Payer: BC Managed Care – PPO | Admitting: Clinical

## 2022-11-21 ENCOUNTER — Telehealth (INDEPENDENT_AMBULATORY_CARE_PROVIDER_SITE_OTHER): Payer: BC Managed Care – PPO | Admitting: Psychiatry

## 2022-11-21 ENCOUNTER — Encounter (HOSPITAL_COMMUNITY): Payer: Self-pay | Admitting: Psychiatry

## 2022-11-21 DIAGNOSIS — F411 Generalized anxiety disorder: Secondary | ICD-10-CM | POA: Diagnosis not present

## 2022-11-21 DIAGNOSIS — F331 Major depressive disorder, recurrent, moderate: Secondary | ICD-10-CM | POA: Diagnosis not present

## 2022-11-21 MED ORDER — FLUOXETINE HCL 20 MG PO CAPS: 60.0000 mg | ORAL_CAPSULE | Freq: Every day | ORAL | 3 refills | Status: DC

## 2022-11-21 MED ORDER — HYDROXYZINE HCL 10 MG PO TABS: 10.0000 mg | ORAL_TABLET | Freq: Three times a day (TID) | ORAL | 3 refills | Status: DC | PRN

## 2022-11-21 MED ORDER — GABAPENTIN 300 MG PO CAPS: ORAL_CAPSULE | ORAL | 3 refills | Status: DC

## 2022-11-21 NOTE — Progress Notes (Signed)
BH MD/PA/NP OP Progress Note Virtual Visit via Video Note  I connected with Dwayne Cortez on 11/21/22 at  2:00 PM EDT by a video enabled telemedicine application and verified that I am speaking with the correct person using two identifiers.  Location: Patient: Home Provider: Clinic   I discussed the limitations of evaluation and management by telemedicine and the availability of in person appointments. The patient expressed understanding and agreed to proceed.  I provided 30 minutes of non-face-to-face time during this encounter.      11/21/2022 2:15 PM Dwayne Cortez  MRN:  [id number]  Chief Complaint: "I been struggling a little"  HPI: 52 year old male seen today for follow up psychiatric evaluation. 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  He is currently managed on Prozac 60 mg daily, hydroxyzine 10 mg 3 times daily as needed, and gabapentin 300 mg three daily.  He notes that his mediations are effective in managing his psychiatric conditions.     Today patient was well groomed, pleasant, cooperative,  engaged in conversation and maintained eye contact.  He informed Clinical research associate that since increasing Prozac to 60 mg his mood is more stable and his anxiety and depression are well-managed.  Today provider conducted GAD-7 and patient scored a 2, at his last visit he scored a 13.  Provider also conducted PHQ-9 and patient scored an 2, at his last visit he scored an 73.  He endorses adequate sleep and appetite.  Today he denies SI/HI/VAH, mania, paranoia.    No medication changes made today.  Patient agreeable to continue medication as prescribed. No other concerns at this time.    Visit Diagnosis:    ICD-10-CM   1. Generalized anxiety disorder  F41.1 hydrOXYzine (ATARAX) 10 MG tablet    gabapentin (NEURONTIN) 300 MG  capsule    2. 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 No pertinent family history.  Social History:  Social History   Socioeconomic History   Marital status: Single    Spouse name: Not on file   Number of children: Not on file   Years of education: Not on file   Highest education level: Not on file  Occupational History   Not on file  Tobacco Use   Smoking status: Every Day    Current packs/day: 1.50    Types: Cigarettes   Smokeless tobacco: Never  Substance and Sexual Activity   Alcohol use: Yes    Comment: occ   Drug use: Not Currently    Types: Cocaine    Comment: hx of cocaine and heroin   Sexual activity: Not on file  Other Topics Concern   Not on file  Social History Narrative   Not on file   Social Determinants of Health   Financial Resource Strain: Not on file  Food Insecurity: No Food Insecurity (06/28/2021)   Received from [facility]   Hunger Vital Sign  Transportation Needs: Not on file  Physical Activity: Not on file  Stress: Not on file  Social Connections: Not on file    Allergies: No Known Allergies  Metabolic Disorder Labs: No results found for: "HGBA1C", "MPG" No results found for: "PROLACTIN" No results found for: "CHOL", "TRIG", "HDL", "CHOLHDL", "VLDL", "LDLCALC" No results found for: "TSH"  Therapeutic Level Labs: No results found for: "LITHIUM" No results found for: "VALPROATE" Lab Results  Component Value Date    CBMZ 7.8 10/17/2011    Current Medications: Current Outpatient Medications  Medication Sig Dispense Refill   FLUoxetine (PROZAC) 20 MG capsule Take 3 capsules (60 mg total) by mouth daily. 90 capsule 3   gabapentin (NEURONTIN) 300 MG capsule TAKE 1 CAPSULE(300 MG) BY MOUTH THREE TIMES DAILY 90 capsule 3   hydrOXYzine (ATARAX) 10 MG tablet Take 1 tablet (10 mg total) by mouth 3 (three) times daily as needed. 90 tablet 3   rosuvastatin (CRESTOR) 20 MG tablet Take 20 mg by mouth at bedtime.     No current facility-administered medications for this visit.     Musculoskeletal: Strength & Muscle Tone: within normal limits and telehealth visit Gait & Station: normal, telehealth visit Patient leans: N/A  Psychiatric Specialty Exam: Review of Systems  There were no vitals taken for this visit.There is no height or weight on file to calculate BMI.  General Appearance: Well Groomed  Eye Contact:  Good  Speech:  Clear and Coherent and Normal Rate  Volume:  Normal  Mood:  Euthymic  Affect:  Appropriate and Congruent  Thought Process:  Coherent, Goal Directed and Linear  Orientation:  Full (Time, Place, and Person)  Thought Content: WDL and Logical   Suicidal Thoughts:  No  Homicidal Thoughts:  No  Memory:  Immediate;   Good Recent;   Good Remote;   Good  Judgement:  Good  Insight:  Good  Psychomotor Activity:  Normal  Concentration:  Concentration: Good and Attention Span: Good  Recall:  Good  Fund of Knowledge: Good  Language: Good  Akathisia:  No  Handed:  Right  AIMS (if indicated): Not done  Assets:  Communication Skills Desire for Improvement Financial Resources/Insurance Housing Intimacy Social Support  ADL's:  Intact  Cognition: WNL  Sleep:  Good   Screenings: GAD-7    Flowsheet Row Video Visit from 11/21/2022 in [facility] Video Visit from 09/06/2022 in [facility] Video Visit from 10/25/2021 in [facility] Video Visit from 03/17/2021 in [facility] Video Visit from 12/14/2020 in [facility]  Total GAD-7 Score 2 13 4 6 5       PHQ2-9    Flowsheet Row Video Visit from 11/21/2022 in [facility] Video Visit from 09/06/2022 in [facility] Video Visit from 10/25/2021 in [facility] Video Visit from 03/17/2021 in [facility] Video Visit from 12/14/2020 in [facility]  PHQ-2 Total Score 0 4 2 2  0  PHQ-9 Total Score 2 18 4 7 2       Flowsheet Row ED from 10/31/2021 in [facility] at [facility]) Most recent reading at 10/31/2021 12:20 PM ED from 03/25/2021 in [facility] Emergency Department at [facility] Most recent reading at 03/25/2021 12:35 PM ED from 03/25/2021 in [facility] Ur[facility] Care at [facility]) Most recent reading at 03/25/2021 10:31 AM  C-SSRS RISK CATEGORY No Risk No Risk No Risk        Assessment and Plan: Patient reports that he is doing well on his current medication regimen.No medication changes made today.  Patient agreeable to continue medication as prescribed. 1. Generalized anxiety disorder  Continue- gabapentin (NEURONTIN) 300 MG capsule; TAKE 1 CAPSULE(300 MG) BY MOUTH THREE TIMES DAILY  Dispense: 90 capsule; Refill: 3 Continue- hydrOXYzine (ATARAX) 10 MG tablet; Take 1 tablet (10 mg total) by mouth 3 (three) times daily as needed.  Dispense: 90 tablet; Refill: 3  2. Major depressive disorder, recurrent episode, moderate (HCC)  Continue- FLUoxetine (PROZAC) 20 MG capsule; Take 3 capsules (60 mg total) by mouth daily.  Dispense: 90 capsule; Refill: 3  Follow-up in 3 months Follow-up with therapy  Shanna Cisco, NP 11/21/2022, 2:15 PM

## 2022-12-03 DIAGNOSIS — K635 Polyp of colon: Secondary | ICD-10-CM | POA: Diagnosis not present

## 2022-12-03 DIAGNOSIS — D120 Benign neoplasm of cecum: Secondary | ICD-10-CM | POA: Diagnosis not present

## 2022-12-03 DIAGNOSIS — K5730 Diverticulosis of large intestine without perforation or abscess without bleeding: Secondary | ICD-10-CM | POA: Diagnosis not present

## 2022-12-03 DIAGNOSIS — K625 Hemorrhage of anus and rectum: Secondary | ICD-10-CM | POA: Diagnosis not present

## 2022-12-03 DIAGNOSIS — D122 Benign neoplasm of ascending colon: Secondary | ICD-10-CM | POA: Diagnosis not present

## 2022-12-03 DIAGNOSIS — D123 Benign neoplasm of transverse colon: Secondary | ICD-10-CM | POA: Diagnosis not present

## 2022-12-03 DIAGNOSIS — D124 Benign neoplasm of descending colon: Secondary | ICD-10-CM | POA: Diagnosis not present

## 2022-12-03 DIAGNOSIS — K648 Other hemorrhoids: Secondary | ICD-10-CM | POA: Diagnosis not present

## 2022-12-03 DIAGNOSIS — D125 Benign neoplasm of sigmoid colon: Secondary | ICD-10-CM | POA: Diagnosis not present

## 2022-12-07 DIAGNOSIS — K649 Unspecified hemorrhoids: Secondary | ICD-10-CM | POA: Insufficient documentation

## 2022-12-07 DIAGNOSIS — K5790 Diverticulosis of intestine, part unspecified, without perforation or abscess without bleeding: Secondary | ICD-10-CM | POA: Insufficient documentation

## 2022-12-07 DIAGNOSIS — Z860101 Personal history of adenomatous and serrated colon polyps: Secondary | ICD-10-CM | POA: Insufficient documentation

## 2023-02-17 ENCOUNTER — Encounter (HOSPITAL_COMMUNITY): Payer: Self-pay

## 2023-02-17 ENCOUNTER — Other Ambulatory Visit: Payer: Self-pay

## 2023-02-17 ENCOUNTER — Emergency Department (HOSPITAL_COMMUNITY): Payer: Medicaid Other

## 2023-02-17 ENCOUNTER — Emergency Department (HOSPITAL_COMMUNITY)
Admission: EM | Admit: 2023-02-17 | Discharge: 2023-02-17 | Disposition: A | Discharge status: Home / Self Care | Payer: Medicaid Other | Attending: Emergency Medicine | Admitting: Emergency Medicine

## 2023-02-17 DIAGNOSIS — I10 Essential (primary) hypertension: Secondary | ICD-10-CM | POA: Diagnosis not present

## 2023-02-17 DIAGNOSIS — Z79899 Other long term (current) drug therapy: Secondary | ICD-10-CM | POA: Diagnosis not present

## 2023-02-17 DIAGNOSIS — J441 Chronic obstructive pulmonary disease with (acute) exacerbation: Secondary | ICD-10-CM

## 2023-02-17 DIAGNOSIS — R030 Elevated blood-pressure reading, without diagnosis of hypertension: Secondary | ICD-10-CM

## 2023-02-17 DIAGNOSIS — Z1152 Encounter for screening for COVID-19: Secondary | ICD-10-CM | POA: Insufficient documentation

## 2023-02-17 DIAGNOSIS — R0602 Shortness of breath: Secondary | ICD-10-CM | POA: Diagnosis not present

## 2023-02-17 DIAGNOSIS — J449 Chronic obstructive pulmonary disease, unspecified: Secondary | ICD-10-CM | POA: Diagnosis not present

## 2023-02-17 DIAGNOSIS — R059 Cough, unspecified: Secondary | ICD-10-CM | POA: Diagnosis not present

## 2023-02-17 DIAGNOSIS — R079 Chest pain, unspecified: Secondary | ICD-10-CM | POA: Diagnosis not present

## 2023-02-17 LAB — CBC WITH DIFFERENTIAL/PLATELET
Abs Immature Granulocytes: 0.01 10*3/uL (ref 0.00–0.07)
Basophils Absolute: 0.1 10*3/uL (ref 0.0–0.1)
Basophils Relative: 1 %
Eosinophils Absolute: 1 10*3/uL — ABNORMAL HIGH (ref 0.0–0.5)
Eosinophils Relative: 11 %
HCT: 51.4 % (ref 39.0–52.0)
Hemoglobin: 17.6 g/dL — ABNORMAL HIGH (ref 13.0–17.0)
Immature Granulocytes: 0 %
Lymphocytes Relative: 29 %
Lymphs Abs: 2.6 10*3/uL (ref 0.7–4.0)
MCH: 31.2 pg (ref 26.0–34.0)
MCHC: 34.2 g/dL (ref 30.0–36.0)
MCV: 91 fL (ref 80.0–100.0)
Monocytes Absolute: 0.7 10*3/uL (ref 0.1–1.0)
Monocytes Relative: 8 %
Neutro Abs: 4.6 10*3/uL (ref 1.7–7.7)
Neutrophils Relative %: 51 %
Platelets: 279 10*3/uL (ref 150–400)
RBC: 5.65 MIL/uL (ref 4.22–5.81)
RDW: 13.8 % (ref 11.5–15.5)
WBC: 9 10*3/uL (ref 4.0–10.5)
nRBC: 0 % (ref 0.0–0.2)

## 2023-02-17 LAB — RESP PANEL BY RT-PCR (RSV, FLU A&B, COVID)  RVPGX2
Influenza A by PCR: NEGATIVE
Influenza B by PCR: NEGATIVE
Resp Syncytial Virus by PCR: NEGATIVE
SARS Coronavirus 2 by RT PCR: NEGATIVE

## 2023-02-17 LAB — BASIC METABOLIC PANEL
Anion gap: 14 (ref 5–15)
BUN: <5 mg/dL — ABNORMAL LOW (ref 6–20)
CO2: 23 mmol/L (ref 22–32)
Calcium: 9.3 mg/dL (ref 8.9–10.3)
Chloride: 102 mmol/L (ref 98–111)
Creatinine, Ser: 0.88 mg/dL (ref 0.61–1.24)
GFR, Estimated: >60 mL/min (ref >60)
Glucose, Bld: 111 mg/dL — ABNORMAL HIGH (ref 70–99)
Potassium: 4.2 mmol/L (ref 3.5–5.1)
Sodium: 139 mmol/L (ref 135–145)

## 2023-02-17 LAB — TROPONIN I (HIGH SENSITIVITY)
Troponin I (High Sensitivity): 6 ng/L (ref <18)
Troponin I (High Sensitivity): 7 ng/L (ref <18)

## 2023-02-17 LAB — BRAIN NATRIURETIC PEPTIDE: B Natriuretic Peptide: 28.2 pg/mL (ref 0.0–100.0)

## 2023-02-17 MED ORDER — IPRATROPIUM-ALBUTEROL 0.5-2.5 (3) MG/3ML IN SOLN
3.0000 mL | Freq: Once | RESPIRATORY_TRACT | Status: AC
  Administered 2023-02-17: 3 mL via RESPIRATORY_TRACT
  Filled 2023-02-17: qty 3

## 2023-02-17 MED ORDER — METHYLPREDNISOLONE SODIUM SUCC 125 MG IJ SOLR
125.0000 mg | Freq: Once | INTRAMUSCULAR | Status: AC
  Administered 2023-02-17: 125 mg via INTRAVENOUS
  Filled 2023-02-17: qty 2

## 2023-02-17 MED ORDER — ALBUTEROL SULFATE HFA 108 (90 BASE) MCG/ACT IN AERS: 1.0000 | INHALATION_SPRAY | Freq: Four times a day (QID) | RESPIRATORY_TRACT | 0 refills | Status: DC | PRN

## 2023-02-17 MED ORDER — DOXYCYCLINE HYCLATE 100 MG PO CAPS: 100.0000 mg | ORAL_CAPSULE | Freq: Two times a day (BID) | ORAL | 0 refills | Status: AC

## 2023-02-17 MED ORDER — PREDNISONE 20 MG PO TABS: 40.0000 mg | ORAL_TABLET | Freq: Every day | ORAL | 0 refills | Status: AC

## 2023-02-17 NOTE — ED Notes (Signed)
Pt declined to wear gown. Pt draping hoodie over body to keep warm. Pt was provided two warm blankets.

## 2023-02-17 NOTE — ED Triage Notes (Signed)
Pt arrived POV from home c/o [facility] that has gradually gotten worse since Friday. Pt also states he is coughing as well but now has a metallic taste in his mouth when he does. Pt also endorses left sided CP. Pt states it feels like his lungs are on fire.

## 2023-02-17 NOTE — ED Notes (Signed)
RN ambulated pt around stretcher four times. Pt O2 sat 91-92% with activity and 97% while sitting.

## 2023-02-17 NOTE — ED Provider Notes (Signed)
[place] EMERGENCY DEPARTMENT AT [facility] Provider Note   CSN: [id number] Arrival date & time: 02/17/23  1002     History  Chief Complaint  Patient presents with  . Shortness of Breath    Dwayne Cortez is a 52 y.o. male with a past medical history significant for alcohol abuse, anxiety, cocaine abuse, depression, and hypertension who presents to the ED due to worsening shortness of breath for the past 2 to 3 days.  Shortness of breath associated with cough.  Also admits to some left-sided chest pain.  No cardiac history.  Patient admits to tobacco use.  Notes a doctor has previously said he has emphysema however, no formal diagnosis.  Does not see a PCP.  Notes shortness of breath is worse when lying down.  No history of CHF.  Denies lower extremity edema.  No history of blood clots, recent surgeries, recent long immobilizations.  Denies fever.  History obtained from patient and past medical records. No interpreter used during encounter.       Home Medications Prior to Admission medications   Medication Sig Start Date End Date Taking? Authorizing Provider  albuterol (VENTOLIN HFA) 108 (90 Base) MCG/ACT inhaler Inhale 1-2 puffs into the lungs every 6 (six) hours as needed for wheezing or shortness of breath. 02/17/23  Yes Jansen Sciuto, Merla Riches, PA-C  doxycycline (VIBRAMYCIN) 100 MG capsule Take 1 capsule (100 mg total) by mouth 2 (two) times daily for 5 days. 02/17/23 02/22/23 Yes Rudell Marlowe, Merla Riches, PA-C  predniSONE (DELTASONE) 20 MG tablet Take 2 tablets (40 mg total) by mouth daily for 5 days. 02/17/23 02/22/23 Yes Dodger Sinning, Merla Riches, PA-C  FLUoxetine (PROZAC) 20 MG capsule Take 3 capsules (60 mg total) by mouth daily. 11/21/22   Shanna Cisco, NP  gabapentin (NEURONTIN) 300 MG capsule TAKE 1 CAPSULE(300 MG) BY MOUTH THREE TIMES DAILY 11/21/22   Toy Cookey E, NP  hydrOXYzine (ATARAX) 10 MG tablet Take 1 tablet (10 mg total) by mouth 3 (three) times daily  as needed. 11/21/22   Shanna Cisco, NP  rosuvastatin (CRESTOR) 20 MG tablet Take 20 mg by mouth at bedtime. 08/28/21   [provider]      Allergies    Patient has no known allergies.    Review of Systems   Review of Systems  Constitutional:  Negative for fever.  Respiratory:  Positive for cough and shortness of breath.   Cardiovascular:  Positive for chest pain. Negative for leg swelling.  Gastrointestinal:  Negative for abdominal pain.    Physical Exam Updated Vital Signs BP (!) 173/113   Pulse 82   Temp (!) 97.5 F (36.4 C) (Axillary)   Resp (!) 23   Ht 6\' 2"  (1.88 m)   Wt 79.4 kg   SpO2 96%   BMI 22.47 kg/m  Physical Exam Vitals and nursing note reviewed.  Constitutional:      General: He is not in acute distress.    Appearance: He is not ill-appearing.  HENT:     Head: Normocephalic.  Eyes:     Pupils: Pupils are equal, round, and reactive to light.  Cardiovascular:     Rate and Rhythm: Normal rate and regular rhythm.     Pulses: Normal pulses.     Heart sounds: Normal heart sounds. No murmur heard.    No friction rub. No gallop.  Pulmonary:     Effort: Pulmonary effort is normal.     Breath sounds: Wheezing present.  Abdominal:     General: Abdomen is flat. There is no distension.     Palpations: Abdomen is soft.     Tenderness: There is no abdominal tenderness. There is no guarding or rebound.  Musculoskeletal:        General: Normal range of motion.     Cervical back: Neck supple.     Comments: No lower extremity edema  Skin:    General: Skin is warm and dry.  Neurological:     General: No focal deficit present.     Mental Status: He is alert.  Psychiatric:        Mood and Affect: Mood normal.        Behavior: Behavior normal.     ED Results / Procedures / Treatments   Labs (all labs ordered are listed, but only abnormal results are displayed) Labs Reviewed  CBC WITH DIFFERENTIAL/PLATELET - Abnormal; Notable for the following  components:      Result Value   Hemoglobin 17.6 (*)    Eosinophils Absolute 1.0 (*)    All other components within normal limits  BASIC METABOLIC PANEL - Abnormal; Notable for the following components:   Glucose, Bld 111 (*)    BUN <5 (*)    All other components within normal limits  RESP PANEL BY RT-PCR (RSV, FLU A&B, COVID)  RVPGX2  BRAIN NATRIURETIC PEPTIDE  TROPONIN I (HIGH SENSITIVITY)  TROPONIN I (HIGH SENSITIVITY)    EKG None  Radiology DG Chest 2 View  Result Date: 02/17/2023 CLINICAL DATA:  Cough and chest pain.  Shortness of breath. EXAM: CHEST - 2 VIEW COMPARISON:  Chest and rib radiographs 03/25/2021. FINDINGS: Heart size is normal. Changes of COPD are again noted. No focal airspace disease is present. No edema or effusion is present. Degenerative changes are present at the anterior first rib articulations bilaterally, left greater than right. IMPRESSION: 1. No acute cardiopulmonary disease. 2. COPD. Electronically Signed   By: Marin Roberts M.D.   On: 02/17/2023 11:26    Procedures Procedures    Medications Ordered in ED Medications  ipratropium-albuterol (DUONEB) 0.5-2.5 (3) MG/3ML nebulizer solution 3 mL (3 mLs Nebulization Given 02/17/23 1131)  methylPREDNISolone sodium succinate (SOLU-MEDROL) 125 mg/2 mL injection 125 mg (125 mg Intravenous Given 02/17/23 1131)    ED Course/ Medical Decision Making/ A&P Clinical Course as of 02/17/23 1413  Sun Feb 17, 2023  1213 WBC: 9.0 [CA]  1240 B Natriuretic Peptide: 28.2 [CA]  1308 Reassessed patient at bedside.  Patient with significant improvement in shortness of breath after DuoNeb treatment.  Lungs clear to auscultation bilaterally.  No longer wheezing.  Suspect patient has underlying COPD.  Suspect symptoms related to COPD exacerbation.  Patient ambulated here in the ED and lowest O2 saturation was 91% (suspect patient's baseline with ambulating is around this low given likely history of underlying COPD)  At  rest patient is above 95% with good waveform on monitor.  Awaiting second troponin.  If normal patient stable for discharge. [CA]    Clinical Course User Index [CA] Mannie Stabile, PA-C                                 Medical Decision Making Amount and/or Complexity of Data Reviewed Labs: ordered. Decision-making details documented in ED Course. Radiology: ordered and independent interpretation performed. Decision-making details documented in ED Course. ECG/medicine tests: ordered and independent interpretation performed. Decision-making details documented in  ED Course.  Risk Prescription drug management.   This patient presents to the ED for concern of SOB, this involves an extensive number of treatment options, and is a complaint that carries with it a high risk of complications and morbidity.  The differential diagnosis includes COPD, CHF, PNA, ACS, viral process, etc  52 year old male presents to the ED due to worsening shortness of breath associated with cough and chest pain x 2 to 3 days.  Does not see a PCP however, notes someone has told him in the past that he has "emphysema".  Admits to tobacco abuse.  Shortness of breath worse when lying flat.  No history of CHF.  Upon arrival patient afebrile, not tachycardic with O2 saturation of 94% on room air.  Patient in no acute distress.  Lungs with expiratory wheeze throughout.  No lower extremity edema.  Routine labs ordered.  Chest x-ray to rule out evidence of pneumonia.  BNP to rule out CHF given some orthopnea.  Suspect patient has underlying COPD. Will treat with Solu-Medrol and DuoNeb treatment.  RVP ordered to rule out infection.   CBC reassuring.  No leukocytosis.  BMP with normal creatinine.  No major electrolyte derangements.  COVID/influenza negative.  BNP normal.  Low suspicion for CHF.  Chest x-ray personally reviewed and interpreted which is negative for any acute abnormalities.  Does demonstrate changes consistent with  COPD.  Agree with radiology interpretation. EKG normal sinus rhythm.  No signs of acute ischemia. Troponin normal x2. Low suspicion for ACS.  Given wheeze on exam feel symptoms are likely related to COPD exacerbation.  Low suspicion for PE.  No evidence of DVT on exam.  Reassessed patient as noted above.  Patient with significant improvement in SOB.  Patient stable for discharge.  Patient discharged with antibiotics, steroids, and albuterol.  [facility] number given to patient at discharge and advised to call to schedule an appointment for further evaluation. Strict ED precautions discussed with patient. Patient states understanding and agrees to plan. Patient discharged home in no acute distress and stable vitals  Co morbidities that complicate the patient evaluation  Alcohol abuse Cardiac Monitoring: / EKG:  The patient was maintained on a cardiac monitor.  I personally viewed and interpreted the cardiac monitored which showed an underlying rhythm of: NSR  Social Determinants of Health:  No PCP  Test / Admission - Considered:  Considered CTA chest; however PE thought to be less likely.        Final Clinical Impression(s) / ED Diagnoses Final diagnoses:  COPD exacerbation (HCC)    Rx / DC Orders ED Discharge Orders          Ordered    predniSONE (DELTASONE) 20 MG tablet  Daily        02/17/23 1318    albuterol (VENTOLIN HFA) 108 (90 Base) MCG/ACT inhaler  Every 6 hours PRN        02/17/23 1318    doxycycline (VIBRAMYCIN) 100 MG capsule  2 times daily        02/17/23 1318              Jesusita Oka 02/17/23 1407    Gerhard Munch, MD 02/17/23 2002

## 2023-02-17 NOTE — Discharge Instructions (Addendum)
It was a pleasure taking care of you today.  As discussed, I suspect you are having a flareup of your COPD.  I am sending you home with steroids, antibiotics, and an inhaler. Use the inhaler as needed.  I have included the number of [facility].  Call tomorrow to schedule an appointment to establish care.  Return to the ER for any worsening symptoms.  Your blood pressure was elevated during your ED stay. Please have it rechecked in 2-3 days.

## 2023-02-20 ENCOUNTER — Encounter (HOSPITAL_COMMUNITY): Payer: Self-pay | Admitting: Psychiatry

## 2023-02-20 ENCOUNTER — Telehealth (INDEPENDENT_AMBULATORY_CARE_PROVIDER_SITE_OTHER): Payer: Medicaid Other | Admitting: Psychiatry

## 2023-02-20 DIAGNOSIS — F411 Generalized anxiety disorder: Secondary | ICD-10-CM

## 2023-02-20 DIAGNOSIS — F331 Major depressive disorder, recurrent, moderate: Secondary | ICD-10-CM

## 2023-02-20 MED ORDER — HYDROXYZINE HCL 10 MG PO TABS: 10.0000 mg | ORAL_TABLET | Freq: Three times a day (TID) | ORAL | 3 refills | Status: DC | PRN

## 2023-02-20 MED ORDER — GABAPENTIN 300 MG PO CAPS: ORAL_CAPSULE | ORAL | 3 refills | Status: DC

## 2023-02-20 MED ORDER — FLUOXETINE HCL 20 MG PO CAPS: 60.0000 mg | ORAL_CAPSULE | Freq: Every day | ORAL | 3 refills | Status: DC

## 2023-02-20 NOTE — Progress Notes (Signed)
BH MD/PA/NP OP Progress Note Virtual Visit via Video Note  I connected with Dwayne Cortez on 02/20/23 at  2:30 PM EDT by a video enabled telemedicine application and verified that I am speaking with the correct person using two identifiers.  Location: Patient: Home Provider: Clinic   I discussed the limitations of evaluation and management by telemedicine and the availability of in person appointments. The patient expressed understanding and agreed to proceed.  I provided 30 minutes of non-face-to-face time during this encounter.      02/20/2023 2:25 PM Dwayne Cortez  MRN:  [id number]  Chief Complaint: "I am doing okay"  HPI: 52 year old male seen today for follow up psychiatric evaluation. 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  He is currently managed on Prozac 60 mg daily, hydroxyzine 10 mg 3 times daily as needed, and gabapentin 300 mg three daily.  He notes that his mediations are effective in managing his psychiatric conditions.     Today patient was well groomed, pleasant, cooperative,  engaged in conversation and maintained eye contact.  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  He notes that recently he found out that he has COPD but is managing well.  Patient notes that his mood is stable and notes that he has minimal anxiety and depression.  He denies SI/HI/VAH, mania, or paranoia.   No medication changes made today.  Patient agreeable to continue medication as prescribed. No other concerns at this time.    Visit Diagnosis:  No diagnosis found.    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  Social History:  Social History   Socioeconomic History   Marital status: Single    Spouse name: Not on file   Number of children: Not on file   Years of education: Not on file   Highest education level: Not on file  Occupational History   Not on file  Tobacco Use   Smoking status: Every Day    Current packs/day: 1.50    Types: Cigarettes   Smokeless tobacco: Never  Substance and Sexual Activity   Alcohol use: Yes    Comment: occ   Drug use: Not Currently    Types: Cocaine    Comment: hx of cocaine and heroin   Sexual activity: Not on file  Other Topics Concern   Not on file  Social History Narrative   Not on file   Social Determinants of Health   Financial Resource Strain: Not on file  Food Insecurity: No Food Insecurity (06/28/2021)   Received from [facility]   Hunger Vital Sign  Transportation Needs: Not on file  Physical Activity: Not on file  Stress: Not on file  Social Connections: Not on file    Allergies: No Known Allergies  Metabolic Disorder Labs: No results found for: "HGBA1C", "MPG" No results found for: "PROLACTIN" No results found for: "CHOL", "TRIG", "HDL", "CHOLHDL", "VLDL", "LDLCALC" No results found for: "TSH"  Therapeutic  Level Labs: No results found for: "LITHIUM" No results found for: "VALPROATE" Lab Results  Component Value Date   CBMZ 7.8 10/17/2011    Current Medications: Current Outpatient Medications  Medication Sig Dispense Refill   albuterol (VENTOLIN HFA) 108 (90 Base) MCG/ACT inhaler Inhale 1-2 puffs into the lungs every 6 (six) hours as needed for wheezing or shortness of breath. 18 g 0   doxycycline (VIBRAMYCIN) 100 MG capsule Take 1 capsule (100 mg total) by mouth 2 (two) times daily for 5 days. 10  capsule 0   FLUoxetine (PROZAC) 20 MG capsule Take 3 capsules (60 mg total) by mouth daily. 90 capsule 3   gabapentin (NEURONTIN) 300 MG capsule TAKE 1 CAPSULE(300 MG) BY MOUTH THREE TIMES DAILY 90 capsule 3   hydrOXYzine (ATARAX) 10 MG tablet Take 1 tablet (10 mg total) by mouth 3 (three) times daily as needed. 90 tablet 3   predniSONE (DELTASONE) 20 MG tablet Take 2 tablets (40 mg total) by mouth daily for 5 days. 10 tablet 0   rosuvastatin (CRESTOR) 20 MG tablet Take 20 mg by mouth at bedtime.     No current facility-administered medications for this visit.     Musculoskeletal: Strength & Muscle Tone: within normal limits and telehealth visit Gait & Station: normal, telehealth visit Patient leans: N/A  Psychiatric Specialty Exam: Review of Systems  There were no vitals taken for this visit.There is no height or weight on file to calculate BMI.  General Appearance: Well Groomed  Eye Contact:  Good  Speech:  Clear and Coherent and Normal Rate  Volume:  Normal  Mood:  Euthymic  Affect:  Appropriate and Congruent  Thought Process:  Coherent, Goal Directed and Linear  Orientation:  Full (Time, Place, and Person)  Thought Content: WDL and Logical   Suicidal Thoughts:  No  Homicidal Thoughts:  No  Memory:  Immediate;   Good Recent;   Good Remote;   Good  Judgement:  Good  Insight:  Good  Psychomotor Activity:  Normal  Concentration:  Concentration: Good and Attention Span: Good  Recall:  Good  Fund of Knowledge: Good  Language: Good  Akathisia:  No  Handed:  Right  AIMS (if indicated): Not done  Assets:  Communication Skills Desire for Improvement Financial Resources/Insurance Housing Intimacy Social Support  ADL's:  Intact  Cognition: WNL  Sleep:  Good   Screenings: GAD-7    Flowsheet Row Video Visit from 11/21/2022 in [facility] Video Visit from 09/06/2022 in [facility] Video Visit from 10/25/2021 in  [facility] Video Visit from 03/17/2021 in [facility] Video Visit from 12/14/2020 in [facility]  Total GAD-7 Score 2 13 4 6 5       PHQ2-9    Flowsheet Row Video Visit from 11/21/2022 in [facility] Video Visit from 09/06/2022 in [facility] Video Visit from 10/25/2021 in [facility] Video Visit from 03/17/2021 in [facility] Video Visit from 12/14/2020 in [facility]  PHQ-2 Total Score 0 4 2 2  0  PHQ-9 Total Score 2 18 4 7 2       Flowsheet Row ED from 02/17/2023 in [facility] Emergency Department at [facility] ED from 10[facility]31/2021 in [facility] ED from 03/25/2021 in [facility] Emergency Department at [facility]  C-SSRS  RISK CATEGORY No Risk No Risk No Risk        Assessment and Plan: Patient reports that he is doing well on his current medication regimen.No medication changes made today.  Patient agreeable to continue medication as prescribed. 1. Generalized anxiety disorder  Continue- gabapentin (NEURONTIN) 300 MG capsule; TAKE 1 CAPSULE(300 MG) BY MOUTH THREE TIMES DAILY  Dispense: 90 capsule; Refill: 3 Continue- hydrOXYzine (ATARAX) 10 MG tablet; Take 1 tablet (10 mg total) by mouth 3 (three) times daily as needed.  Dispense: 90 tablet; Refill: 3  2. Major depressive disorder, recurrent episode, moderate (HCC)  Continue- FLUoxetine (PROZAC) 20 MG capsule; Take 3 capsules (60 mg total) by mouth daily.  Dispense: 90 capsule; Refill: 3  Follow-up in 3 months Follow-up with therapy  Shanna Cisco, NP 02/20/2023, 2:25 PM

## 2023-05-14 ENCOUNTER — Telehealth (HOSPITAL_COMMUNITY): Payer: Medicaid Other | Admitting: Psychiatry

## 2023-05-14 ENCOUNTER — Encounter (HOSPITAL_COMMUNITY): Payer: Self-pay | Admitting: Psychiatry

## 2023-05-14 DIAGNOSIS — F331 Major depressive disorder, recurrent, moderate: Secondary | ICD-10-CM | POA: Diagnosis not present

## 2023-05-14 DIAGNOSIS — F411 Generalized anxiety disorder: Secondary | ICD-10-CM | POA: Diagnosis not present

## 2023-05-14 MED ORDER — GABAPENTIN 300 MG PO CAPS: ORAL_CAPSULE | ORAL | 3 refills | Status: DC

## 2023-05-14 MED ORDER — FLUOXETINE HCL 20 MG PO CAPS: 60.0000 mg | ORAL_CAPSULE | Freq: Every day | ORAL | 3 refills | Status: DC

## 2023-05-14 MED ORDER — HYDROXYZINE HCL 10 MG PO TABS: 10.0000 mg | ORAL_TABLET | Freq: Three times a day (TID) | ORAL | 3 refills | Status: DC | PRN

## 2023-05-14 NOTE — Progress Notes (Signed)
 BH MD/PA/NP OP Progress Note Virtual Visit via Video Note  I connected with Dwayne Cortez on 05/14/23 at  3:00 PM EST by a video enabled telemedicine application and verified that I am speaking with the correct person using two identifiers.  Location: Patient: Home Provider: Clinic   I discussed the limitations of evaluation and management by telemedicine and the availability of in person appointments. The patient expressed understanding and agreed to proceed.  I provided 30 minutes of non-face-to-face time during this encounter.      05/14/2023 3:20 PM Dwayne Cortez  MRN:  [id number]  Chief Complaint: Things are pretty good  HPI: 53 year old male seen today for follow up psychiatric evaluation. 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  He is currently managed on Prozac  60 mg daily, hydroxyzine  10 mg 3 times daily as needed, and gabapentin  300 mg three daily.  He notes that his mediations are effective in managing his psychiatric conditions.     Today patient was well groomed, pleasant, cooperative,  engaged in conversation and maintained eye contact.  He informed clinical research associate  that things are pretty good. He notes that he is staying busy with work. Patient notes that his mood is stable and notes that his anxiety and depression are well managed. Today provider conducted a GAD 7 and patient scored a 2. Provider also conducted a PHQ 9 and patient scored a 2. He endorses adequate sleep and appetite. Today he denies SI/HI/VAH, mania, or paranoia.   No medication changes made today.  Patient agreeable to continue medication as prescribed. No other concerns at this time.    Visit Diagnosis:    ICD-10-CM   1. Major depressive disorder, recurrent episode, moderate (HCC)  F33.1 FLUoxetine  (PROZAC ) 20 MG capsule    2.  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 No pertinent family history.  Social History:  Social History   Socioeconomic History   Marital status: Single    Spouse name: Not on file   Number of children: Not on file   Years of education: Not on file   Highest education level: Not on file  Occupational History   Not on file  Tobacco Use   Smoking status: Every Day    Current packs/day: 1.50    Types: Cigarettes   Smokeless tobacco: Never  Substance and Sexual Activity   Alcohol  use: Yes    Comment: occ   Drug use: Not Currently    Types: Cocaine    Comment: hx of cocaine and heroin   Sexual activity: Not on file  Other Topics Concern   Not on file  Social History Narrative   Not on file   Social Drivers of Health   Financial Resource Strain: Not on file  Food Insecurity: No Food Insecurity (06/28/2021)   Received from [facility]  Hunger Vital Sign  Transportation Needs: Not on file  Physical Activity: Not on file  Stress: Not on file  Social Connections: Not on file    Allergies: No Known Allergies  Metabolic Disorder Labs: No results found for: HGBA1C, MPG No results found for: PROLACTIN No results found for: CHOL, TRIG, HDL, CHOLHDL, VLDL, LDLCALC No results found for: TSH  Therapeutic Level Labs: No results found for: LITHIUM No results found for: VALPROATE Lab Results  Component Value Date   CBMZ 7.8  10/17/2011    Current Medications: Current Outpatient Medications  Medication Sig Dispense Refill   albuterol  (VENTOLIN  HFA) 108 (90 Base) MCG/ACT inhaler Inhale 1-2 puffs into the lungs every 6 (six) hours as needed for wheezing or shortness of breath. 18 g 0   FLUoxetine  (PROZAC ) 20 MG capsule Take 3 capsules (60 mg total) by mouth daily. 90 capsule 3   gabapentin  (NEURONTIN ) 300 MG capsule TAKE 1 CAPSULE(300 MG) BY MOUTH THREE TIMES DAILY 90 capsule 3   hydrOXYzine  (ATARAX ) 10 MG tablet Take 1 tablet (10 mg total) by mouth 3 (three) times daily as needed. 90 tablet 3   rosuvastatin (CRESTOR) 20 MG tablet Take 20 mg by mouth at bedtime.     No current facility-administered medications for this visit.     Musculoskeletal: Strength & Muscle Tone: within normal limits and telehealth visit Gait & Station: normal, telehealth visit Patient leans: N/A  Psychiatric Specialty Exam: Review of Systems  There were no vitals taken for this visit.There is no height or weight on file to calculate BMI.  General Appearance: Well Groomed  Eye Contact:  Good  Speech:  Clear and Coherent and Normal Rate  Volume:  Normal  Mood:  Euthymic  Affect:  Appropriate and Congruent  Thought Process:  Coherent, Goal Directed and Linear  Orientation:  Full (Time, Place, and Person)  Thought Content: WDL and Logical   Suicidal Thoughts:  No  Homicidal Thoughts:  No  Memory:  Immediate;   Good Recent;   Good Remote;   Good  Judgement:  Good  Insight:  Good  Psychomotor Activity:  Normal  Concentration:  Concentration: Good and Attention Span: Good  Recall:  Good  Fund of Knowledge: Good  Language: Good  Akathisia:  No  Handed:  Right  AIMS (if indicated): Not done  Assets:  Communication Skills Desire for Improvement Financial Resources/Insurance Housing Intimacy Social Support  ADL's:  Intact  Cognition: WNL  Sleep:  Good   Screenings: GAD-7    Flowsheet Row [facility]14[facility]2023 in  [facility] Video Visit from 11/21/2022 in [facility] Video Visit from 09/06/2022 in [facility] Video Visit from 10/25/2021 in [facility] Video Visit from 03/17/2021 in [facility]  Total GAD-7 Score 2 2 13 4 6       PHQ2-9    Flowsheet Row Video Visit from 05/14/2023 in [facility] Video Visit from 11/21/2022 in [facility] Video Visit from 09/06/2022 in [facility] Video Visit from 10/25/2021 in [facility] Video Visit from 03/17/2021 in [facility]  PHQ-2 Total Score 0 0 4 2 2   PHQ-9 Total Score 2 2 18 4 7       Flowsheet Row ED from 02/17/2023 in [facility] Department at [facility] ED from 10/31/2021 in [facility]  at [facility]) ED from 03/25/2021 in [facility] Emergency Department at [facility]  C-SSRS RISK CATEGORY No Risk No Risk No Risk        Assessment and Plan: Patient reports that he is doing well on his current medication regimen.No medication changes made today.  Patient agreeable to continue medication as prescribed. 1. Generalized anxiety disorder  Continue- gabapentin  (NEURONTIN ) 300 MG capsule; TAKE 1 CAPSULE(300 MG) BY MOUTH THREE TIMES DAILY  Dispense: 90 capsule; Refill: 3 Continue- hydrOXYzine  (ATARAX ) 10 MG tablet; Take 1 tablet (10 mg total) by mouth 3 (three) times daily as needed.  Dispense: 90 tablet; Refill: 3  2. Major depressive disorder, recurrent episode, moderate (HCC)  Continue- FLUoxetine  (PROZAC ) 20 MG capsule; Take 3 capsules (60 mg total) by mouth daily.  Dispense: 90 capsule; Refill: 3  Follow-up in 3 months Follow-up with therapy  Zane FORBES Bach, NP 05/14/2023, 3:20 PM

## 2023-07-17 ENCOUNTER — Encounter (HOSPITAL_COMMUNITY): Payer: Self-pay | Admitting: Psychiatry

## 2023-07-17 ENCOUNTER — Telehealth (HOSPITAL_COMMUNITY): Payer: Medicaid Other | Admitting: Psychiatry

## 2023-07-17 DIAGNOSIS — F331 Major depressive disorder, recurrent, moderate: Secondary | ICD-10-CM | POA: Diagnosis not present

## 2023-07-17 DIAGNOSIS — R3911 Hesitancy of micturition: Secondary | ICD-10-CM | POA: Diagnosis not present

## 2023-07-17 DIAGNOSIS — Z0000 Encounter for general adult medical examination without abnormal findings: Secondary | ICD-10-CM

## 2023-07-17 DIAGNOSIS — F411 Generalized anxiety disorder: Secondary | ICD-10-CM

## 2023-07-17 DIAGNOSIS — S3994XA Unspecified injury of external genitals, initial encounter: Secondary | ICD-10-CM | POA: Diagnosis not present

## 2023-07-17 MED ORDER — GABAPENTIN 400 MG PO CAPS: ORAL_CAPSULE | ORAL | 3 refills | Status: DC

## 2023-07-17 MED ORDER — FLUOXETINE HCL 20 MG PO CAPS: 60.0000 mg | ORAL_CAPSULE | Freq: Every day | ORAL | 3 refills | Status: DC

## 2023-07-17 MED ORDER — HYDROXYZINE HCL 10 MG PO TABS: 10.0000 mg | ORAL_TABLET | Freq: Three times a day (TID) | ORAL | 3 refills | Status: DC | PRN

## 2023-07-17 NOTE — Progress Notes (Signed)
 BH MD/PA/NP OP Progress Note Virtual Visit via Video Note  I connected with Dwayne Cortez on 07/17/23 at  3:30 PM EDT by a video enabled telemedicine application and verified that I am speaking with the correct person using two identifiers.  Location: Patient: Home Provider: Clinic   I discussed the limitations of evaluation and management by telemedicine and the availability of in person appointments. The patient expressed understanding and agreed to proceed.  I provided 30 minutes of non-face-to-face time during this encounter.      07/17/2023 4:03 PM Dwayne Cortez  MRN:  [id number]  Chief Complaint: "I have hand pain and issues with my penis"  HPI: 53 year old male seen today for follow up psychiatric evaluation. 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  He is currently managed on Prozac 60 mg daily, hydroxyzine 10 mg 3 times daily as needed, and gabapentin 300 mg three daily.  He informed Clinical research associate that he has been taking gabapentin 4 times daily.  He notes that his mediations are effective in managing his psychiatric conditions.     Today patient was well groomed, pleasant, cooperative,  engaged in conversation and maintained eye contact.  Today patient informed writer that he has pain in his hand.  He notes that recently he started drumming and contributes the pain to this.  He does note that he was referred to a rheumatologist but has not had his concerns addressed.  For the last 2 weeks patient notes that he has been issues with urinary hesitancy and discomfort in his penis.  Patient notes that he feels like the bone is protruding out of his penis.  Patient referred to [facility] [place] [facility] for primary care.   Since his last visit he informed writer that his anxiety and depression are well-managed.   Today provider conducted a GAD 7 and patient scored a 7, at his last visit he scored a 2. Provider also conducted a PHQ 9 and patient scored a 4, at his last visit he scored a 2. He endorses adequate sleep and appetite. Today he denies SI/HI/VAH, mania, or paranoia.   Patient referred to [facility] [place] at [facility] for primary care to address his penile and hand issue.  Gabapentin increased from 300 mg 3 times daily to 400 mg 3 times daily.  No other medication changes made.   Patient will continue medications as prescribed.   No other concerns at this time.    Visit Diagnosis:    ICD-10-CM   1. Well adult exam  Z00.00 Ambulatory referral to Internal Medicine    2. Major depressive disorder, recurrent episode, moderate (HCC)  F33.1 FLUoxetine (PROZAC) 20 MG capsule    3. Generalized anxiety disorder  F41.1 gabapentin (NEURONTIN) 400 MG capsule    hydrOXYzine (ATARAX) 10 MG tablet    4. Urinary hesitancy  R39.11     5. 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 No pertinent family history.  Social History:  Social History   Socioeconomic History   Marital status: Single    Spouse name: Not on file   Number of children: Not on file   Years of education: Not on file   Highest education level: Not on file  Occupational History   Not on file  Tobacco Use   Smoking status: Every Day    Current packs/day: 1.50    Types: Cigarettes   Smokeless tobacco: Never  Substance and Sexual Activity   Alcohol use: Yes     Comment: occ   Drug use: Not Currently    Types: Cocaine    Comment: hx of cocaine and heroin   Sexual activity: Not on file  Other Topics Concern   Not on file  Social History Narrative   Not on file   Social Drivers of Health   Financial Resource Strain: Not on file  Food Insecurity: No Food Insecurity (06/28/2021)   Received from [facility]   Hunger Vital Sign  Transportation Needs: Not on file  Physical Activity: Not on file  Stress: Not on file  Social Connections: Not on file    Allergies: No Known Allergies  Metabolic Disorder Labs: No results found for: "HGBA1C", "MPG" No results found for: "PROLACTIN" No results found for: "CHOL", "TRIG", "HDL", "CHOLHDL", "VLDL", "LDLCALC" No results found for: "TSH"  Therapeutic Level Labs: No results found for: "LITHIUM" No results found for: "VALPROATE" Lab Results  Component Value Date   CBMZ 7.8 10/17/2011    Current Medications: Current Outpatient Medications  Medication Sig Dispense Refill   albuterol (VENTOLIN HFA) 108 (90 Base) MCG/ACT inhaler Inhale 1-2 puffs into the lungs every 6 (six) hours as needed for wheezing or shortness of breath. 18 g 0   FLUoxetine (PROZAC) 20 MG capsule Take 3 capsules (60 mg total) by mouth daily. 90 capsule 3   gabapentin (NEURONTIN) 400 MG capsule TAKE 1 CAPSULE(300 MG) BY MOUTH THREE TIMES DAILY 400 capsule 3   hydrOXYzine (ATARAX) 10 MG tablet Take 1 tablet (10 mg total) by mouth 3 (three) times daily as needed. 90 tablet 3   rosuvastatin (CRESTOR) 20 MG tablet Take 20 mg by mouth at bedtime.     No current facility-administered medications for this visit.     Musculoskeletal: Strength & Muscle Tone: within normal limits and telehealth visit Gait & Station: normal, telehealth visit Patient leans: N/A  Psychiatric Specialty Exam: Review of Systems  There were no vitals taken for this visit.There is no height or weight on file to calculate BMI.  General Appearance: Well  Groomed  Eye Contact:  Good  Speech:  Clear and Coherent and Normal Rate  Volume:  Normal  Mood:  Euthymic  Affect:  Appropriate and Congruent  Thought Process:  Coherent, Goal Directed and Linear  Orientation:  Full (Time, Place, and Person)  Thought Content: WDL and Logical   Suicidal Thoughts:  No  Homicidal Thoughts:  No  Memory:  Immediate;   Good Recent;   Good Remote;   Good  Judgement:  Good  Insight:  Good  Psychomotor Activity:  Normal  Concentration:  Concentration: Good and Attention Span: Good  Recall:  Good  Fund of Knowledge: Good  Language: Good  Akathisia:  No  Handed:  Right  AIMS (if indicated): Not done  Assets:  Communication Skills Desire for Improvement Financial Resources/Insurance Housing Intimacy Social Support  ADL's:  Intact  Cognition: WNL  Sleep:  Good   Screenings: GAD-7    Flowsheet Row Video Visit from 07/17/2023 in [facility] Video Visit from 05/14/2023 in [facility] Video Visit from 11/21/2022 in [facility] Video Visit from 09/06/2022 in [facility] Video Visit from 10/25/2021 in [facility]  Total GAD-7 Score 7 2 2 13 4       PHQ2-9    Flowsheet Row Video Visit from 07/17/2023 in [facility] Video Visit from 05/14/2023 in [facility] Video Visit from 11/21/2022 in [facility] Video Visit from 09/06/2022 in [facility] Video Visit from 10/25/2021 in [facility]  PHQ-2 Total Score 1 0 0 4 2  PHQ-9 Total Score 4 2 2 18 4       Flowsheet Row ED from 02/17/2023 in [facility] ED from 10/31/2021 in [facility] Care at [facility]) ED from 03/25/2021 in [facility] at [facility]  C-SSRS RISK CATEGORY No Risk No Risk No Risk        Assessment and Plan: Patient reports that he increase gabapentin to 300 mg 4 times daily.  He notes that it has been effective in managing his anxiety and mood.  He also complains of penis and hand pain.Patient referred to [facility] [place] [facility] for primary care to address his penile and hand issue.  Gabapentin increased from 300 mg 3 times daily to 400 mg 3 times daily.  No other medication changes made.   Patient will continue medications as prescribed    1. Major depressive disorder, recurrent episode, moderate (HCC)  Continue - FLUoxetine (PROZAC) 20 MG capsule; Take 3 capsules (60 mg total) by mouth daily.  Dispense: 90 capsule; Refill: 3  2. Generalized anxiety disorder  Increased- gabapentin (NEURONTIN) 400 MG capsule; TAKE 1 CAPSULE(300 MG) BY MOUTH THREE TIMES DAILY  Dispense: 400 capsule; Refill: 3 Continue- hydrOXYzine (ATARAX) 10 MG tablet; Take 1 tablet (10 mg total) by mouth 3 (three) times daily as needed.  Dispense: 90 tablet; Refill: 3  3. Well adult exam (Primary)  - Ambulatory referral to Internal Medicine  4. Urinary hesitancy Follow-up  5. Injury to penis, initial encounter   Follow-up in 3 months Follow-up with therapy  Shanna Cisco, NP 07/17/2023, 4:03 PM

## 2023-10-02 ENCOUNTER — Telehealth (HOSPITAL_COMMUNITY): Admitting: Psychiatry

## 2023-10-02 ENCOUNTER — Encounter (HOSPITAL_COMMUNITY): Payer: Self-pay

## 2023-10-18 ENCOUNTER — Emergency Department (HOSPITAL_BASED_OUTPATIENT_CLINIC_OR_DEPARTMENT_OTHER)

## 2023-10-18 ENCOUNTER — Encounter (HOSPITAL_BASED_OUTPATIENT_CLINIC_OR_DEPARTMENT_OTHER): Payer: Self-pay | Admitting: Emergency Medicine

## 2023-10-18 ENCOUNTER — Emergency Department (HOSPITAL_BASED_OUTPATIENT_CLINIC_OR_DEPARTMENT_OTHER)
Admission: EM | Admit: 2023-10-18 | Discharge: 2023-10-19 | Disposition: A | Discharge status: Home / Self Care | Attending: Emergency Medicine | Admitting: Emergency Medicine

## 2023-10-18 ENCOUNTER — Other Ambulatory Visit: Payer: Self-pay

## 2023-10-18 DIAGNOSIS — M19021 Primary osteoarthritis, right elbow: Secondary | ICD-10-CM | POA: Diagnosis not present

## 2023-10-18 DIAGNOSIS — S0231XB Fracture of orbital floor, right side, initial encounter for open fracture: Secondary | ICD-10-CM | POA: Diagnosis not present

## 2023-10-18 DIAGNOSIS — J439 Emphysema, unspecified: Secondary | ICD-10-CM | POA: Diagnosis not present

## 2023-10-18 DIAGNOSIS — S59901A Unspecified injury of right elbow, initial encounter: Secondary | ICD-10-CM | POA: Diagnosis not present

## 2023-10-18 DIAGNOSIS — R519 Headache, unspecified: Secondary | ICD-10-CM | POA: Diagnosis not present

## 2023-10-18 DIAGNOSIS — R609 Edema, unspecified: Secondary | ICD-10-CM | POA: Diagnosis not present

## 2023-10-18 DIAGNOSIS — M25521 Pain in right elbow: Secondary | ICD-10-CM | POA: Diagnosis not present

## 2023-10-18 DIAGNOSIS — S0993XA Unspecified injury of face, initial encounter: Secondary | ICD-10-CM | POA: Diagnosis not present

## 2023-10-18 DIAGNOSIS — H538 Other visual disturbances: Secondary | ICD-10-CM | POA: Diagnosis present

## 2023-10-18 DIAGNOSIS — S0501XA Injury of conjunctiva and corneal abrasion without foreign body, right eye, initial encounter: Secondary | ICD-10-CM | POA: Diagnosis not present

## 2023-10-18 DIAGNOSIS — K029 Dental caries, unspecified: Secondary | ICD-10-CM | POA: Diagnosis not present

## 2023-10-18 DIAGNOSIS — S0285XB Fracture of orbit, unspecified, initial encounter for open fracture: Secondary | ICD-10-CM

## 2023-10-18 DIAGNOSIS — M778 Other enthesopathies, not elsewhere classified: Secondary | ICD-10-CM | POA: Diagnosis not present

## 2023-10-18 DIAGNOSIS — M542 Cervicalgia: Secondary | ICD-10-CM | POA: Diagnosis not present

## 2023-10-18 DIAGNOSIS — E161 Other hypoglycemia: Secondary | ICD-10-CM | POA: Diagnosis not present

## 2023-10-18 DIAGNOSIS — R58 Hemorrhage, not elsewhere classified: Secondary | ICD-10-CM | POA: Diagnosis not present

## 2023-10-18 MED ORDER — FLUORESCEIN SODIUM 1 MG OP STRP
1.0000 | ORAL_STRIP | Freq: Once | OPHTHALMIC | Status: AC
  Administered 2023-10-18: 1 via OPHTHALMIC
  Filled 2023-10-18: qty 1

## 2023-10-18 MED ORDER — TETRACAINE HCL 0.5 % OP SOLN
2.0000 [drp] | Freq: Once | OPHTHALMIC | Status: AC
  Administered 2023-10-18: 2 [drp] via OPHTHALMIC
  Filled 2023-10-18: qty 4

## 2023-10-18 MED ORDER — OXYCODONE-ACETAMINOPHEN 5-325 MG PO TABS
1.0000 | ORAL_TABLET | Freq: Once | ORAL | Status: AC
  Administered 2023-10-18: 1 via ORAL
  Filled 2023-10-18: qty 1
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
## 2023-10-19 MED ORDER — OXYCODONE-ACETAMINOPHEN 5-325 MG PO TABS: 1.0000 | ORAL_TABLET | Freq: Four times a day (QID) | ORAL | 0 refills | Status: DC | PRN

## 2023-10-19 MED ORDER — CIPROFLOXACIN HCL 0.3 % OP SOLN: 2.0000 [drp] | Freq: Four times a day (QID) | OPHTHALMIC | 0 refills | Status: DC

## 2023-10-19 MED ORDER — AMOXICILLIN-POT CLAVULANATE 875-125 MG PO TABS: 1.0000 | ORAL_TABLET | Freq: Two times a day (BID) | ORAL | 0 refills | Status: AC
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
## 2023-10-24 DIAGNOSIS — S0285XD Fracture of orbit, unspecified, subsequent encounter for fracture with routine healing: Secondary | ICD-10-CM | POA: Diagnosis not present

## 2023-10-31 ENCOUNTER — Telehealth (HOSPITAL_COMMUNITY): Admitting: Family

## 2023-10-31 DIAGNOSIS — F332 Major depressive disorder, recurrent severe without psychotic features: Secondary | ICD-10-CM | POA: Diagnosis not present

## 2023-10-31 DIAGNOSIS — F331 Major depressive disorder, recurrent, moderate: Secondary | ICD-10-CM

## 2023-10-31 DIAGNOSIS — F411 Generalized anxiety disorder: Secondary | ICD-10-CM | POA: Diagnosis not present

## 2023-10-31 DIAGNOSIS — F1410 Cocaine abuse, uncomplicated: Secondary | ICD-10-CM | POA: Diagnosis not present

## 2023-10-31 MED ORDER — GABAPENTIN 400 MG PO CAPS: ORAL_CAPSULE | ORAL | 3 refills | Status: DC

## 2023-10-31 MED ORDER — FLUOXETINE HCL 20 MG PO CAPS: 60.0000 mg | ORAL_CAPSULE | Freq: Every day | ORAL | 3 refills | Status: DC

## 2023-10-31 MED ORDER — HYDROXYZINE HCL 10 MG PO TABS
10.0000 mg | ORAL_TABLET | Freq: Three times a day (TID) | ORAL | 3 refills | Status: DC | PRN
Start: 2023-10-31 — End: 2024-02-20
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
## 2023-11-01 ENCOUNTER — Encounter (HOSPITAL_COMMUNITY): Payer: Self-pay | Admitting: Family

## 2023-11-07 ENCOUNTER — Ambulatory Visit: Admitting: Family Medicine

## 2023-11-07 ENCOUNTER — Encounter: Payer: Self-pay | Admitting: Family Medicine

## 2023-11-07 VITALS — BP 122/84 | HR 75 | Temp 97.8°F | Ht 74.0 in | Wt 166.0 lb

## 2023-11-07 DIAGNOSIS — M19042 Primary osteoarthritis, left hand: Secondary | ICD-10-CM

## 2023-11-07 DIAGNOSIS — R739 Hyperglycemia, unspecified: Secondary | ICD-10-CM

## 2023-11-07 DIAGNOSIS — Z85828 Personal history of other malignant neoplasm of skin: Secondary | ICD-10-CM | POA: Diagnosis not present

## 2023-11-07 DIAGNOSIS — F330 Major depressive disorder, recurrent, mild: Secondary | ICD-10-CM | POA: Diagnosis not present

## 2023-11-07 DIAGNOSIS — L282 Other prurigo: Secondary | ICD-10-CM | POA: Diagnosis not present

## 2023-11-07 DIAGNOSIS — F411 Generalized anxiety disorder: Secondary | ICD-10-CM

## 2023-11-07 DIAGNOSIS — I2510 Atherosclerotic heart disease of native coronary artery without angina pectoris: Secondary | ICD-10-CM | POA: Diagnosis not present

## 2023-11-07 DIAGNOSIS — E559 Vitamin D deficiency, unspecified: Secondary | ICD-10-CM | POA: Diagnosis not present

## 2023-11-07 DIAGNOSIS — J449 Chronic obstructive pulmonary disease, unspecified: Secondary | ICD-10-CM | POA: Diagnosis not present

## 2023-11-07 DIAGNOSIS — F17200 Nicotine dependence, unspecified, uncomplicated: Secondary | ICD-10-CM

## 2023-11-07 DIAGNOSIS — Z8619 Personal history of other infectious and parasitic diseases: Secondary | ICD-10-CM

## 2023-11-07 NOTE — Progress Notes (Signed)
 New Patient Office Visit  Subjective    Patient ID: Dwayne Cortez, male    DOB: 1971/02/13  Age: 53 y.o. MRN: [id number]  CC:  Chief Complaint  Patient presents with   Establish Care    No previous PCP, rash on arm from sun and bumps on head. Couldn't get into derm    HPI Dwayne Cortez presents to establish care No previous PCP  L[facility] specialist at [facility] for [facility]x hepatitis C  Cardiologist in [facility]  Ophthalmologist   ENT  [facility]   Appt with urologist for penile issue and urinary issue   Requests referral to dermatology due to multiple skin concerns and hx of basal cell carcinoma on his face.   Cocaine use in past   Alcohol  relapse  At 30 days sober today   1 ppd smoker and has smoked for 30+ years.  States he was told he had COPD after having a chest X ray in the ED.  States he had a COPD flare and used albuterol . Requests to see pulmonology    Outpatient Encounter Medications as of 11/07/2023  Medication Sig   FLUoxetine  (PROZAC ) 20 MG capsule Take 3 capsules (60 mg total) by mouth daily.   gabapentin  (NEURONTIN ) 400 MG capsule TAKE 1 CAPSULE(300 MG) BY MOUTH THREE TIMES DAILY   hydrOXYzine  (ATARAX ) 10 MG tablet Take 1 tablet (10 mg total) by mouth 3 (three) times daily as needed.   rosuvastatin (CRESTOR) 20 MG tablet Take 20 mg by mouth at bedtime.   [DISCONTINUED] albuterol  (VENTOLIN  HFA) 108 (90 Base) MCG/ACT inhaler Inhale 1-2 puffs into the lungs every 6 (six) hours as needed for wheezing or shortness of breath.   [DISCONTINUED] ciprofloxacin  (CILOXAN ) 0.3 % ophthalmic solution Place 2 drops into the right eye every 6 (six) hours. While awake.   [DISCONTINUED] oxyCODONE -acetaminophen  (PERCOCET/ROXICET) 5-325 MG tablet Take 1 tablet by mouth every 6 (six) hours as needed for severe pain (pain score 7-10).   No facility-administered encounter medications on file as of 11/07/2023.    Past Medical History:  Diagnosis Date    Alcohol  abuse    Anxiety    Arthritis    Asthma    Closed fracture of orbit with routine healing 10/24/2023   Depression    Diverticulosis 12/07/2022   Emphysema of lung (HCC)    HCV (hepatitis C virus) 11/29/2014   Hemorrhoids 12/07/2022   Hx of adenomatous colonic polyps 12/07/2022   Hypertension    Substance abuse (HCC)     Past Surgical History:  Procedure Laterality Date   KNEE SURGERY      Family History  Problem Relation Age of Onset   Arthritis Father    Diabetes Father    Kidney disease Father    Stroke Father    ADD / ADHD Brother    Anxiety disorder Brother    Depression Sister    Drug abuse Sister     Social History   Socioeconomic History   Marital status: Single    Spouse name: Not on file   Number of children: Not on file   Years of education: Not on file   Highest education level: Associate degree: academic program  Occupational History   Not on file  Tobacco Use   Smoking status: Every Day    Current packs/day: 1.00    Average packs/day: 1 pack/day for 35.0 years (35.0 ttl pk-yrs)    Types: Cigarettes   Smokeless tobacco: Never  Substance and Sexual Activity   Alcohol  use: Not Currently    Comment: occ   Drug use: Not Currently    Types: Cocaine    Comment: hx of cocaine and heroin   Sexual activity: Not Currently    Birth control/protection: None  Other Topics Concern   Not on file  Social History Narrative   Not on file   Social Drivers of Health   Financial Resource Strain: Medium Risk (11/06/2023)   Overall Financial Resource Strain (CARDIA)    Difficulty of Paying Living Expenses: Somewhat hard  Food Insecurity: Food Insecurity Present (11/06/2023)   Hunger Vital Sign    Worried About Running Out of Food in the Last Year: Sometimes true    Ran Out of Food in the Last Year: Sometimes true  Transportation Needs: No Transportation Needs (11/06/2023)   PRAPARE - Administrator, Civil Service (Medical): No    Lack of  Transportation (Non-Medical): No  Physical Activity: Unknown (11/06/2023)   Exercise Vital Sign    Days of Exercise per Week: Patient declined    Minutes of Exercise per Session: Not on file  Stress: Stress Concern Present (11/06/2023)   [employer] of Occupational Health - Occupational Stress Questionnaire    Feeling of Stress: Rather much  Social Connections: Socially Isolated (11/06/2023)   Social Connection and Isolation Panel    Frequency of Communication with Friends and Family: More than three times a week    Frequency of Social Gatherings with Friends and Family: Once a week    Attends Religious Services: Never    Database administrator or Organizations: No    Attends Engineer, structural: Not on file    Marital Status: Never married  Intimate Partner Violence: Not on file    Review of Systems  Constitutional:  Positive for malaise/fatigue. Negative for chills and fever.  Respiratory:  Positive for cough. Negative for shortness of breath and wheezing.   Cardiovascular:  Negative for chest pain, palpitations, orthopnea and leg swelling.  Gastrointestinal:  Negative for abdominal pain, constipation, diarrhea, nausea and vomiting.  Genitourinary:  Negative for dysuria, frequency and urgency.  Musculoskeletal:  Positive for joint pain.  Neurological:  Negative for dizziness and focal weakness.  Psychiatric/Behavioral:  Positive for depression. Negative for suicidal ideas. The patient is nervous/anxious.         Objective    BP 122/84 (BP Location: Left Arm, Patient Position: Sitting)   Pulse 75   Temp 97.8 F (36.6 C) (Temporal)   Ht 6' 2 (1.88 m)   Wt 166 lb (75.3 kg)   SpO2 99%   BMI 21.31 kg/m   Physical Exam Constitutional:      General: He is not in acute distress.    Appearance: He is not ill-appearing.  HENT:     Mouth/Throat:     Mouth: Mucous membranes are moist.     Pharynx: Oropharynx is clear.  Eyes:     Extraocular Movements:  Extraocular movements intact.     Conjunctiva/sclera: Conjunctivae normal.  Cardiovascular:     Rate and Rhythm: Normal rate and regular rhythm.  Pulmonary:     Effort: Pulmonary effort is normal.     Breath sounds: Normal breath sounds.  Musculoskeletal:     Cervical back: Normal range of motion and neck supple.  Skin:    General: Skin is warm and dry.     Comments: Sun damaged skin with multiple nevi  Neurological:  General: No focal deficit present.     Mental Status: He is alert and oriented to person, place, and time.  Psychiatric:        Mood and Affect: Mood normal.        Behavior: Behavior normal.        Thought Content: Thought content normal.         Assessment & Plan:   Problem List Items Addressed This Visit     Generalized anxiety disorder (Chronic)   Coronary artery calcification seen on CT scan - Primary   Major depressive disorder, recurrent episode, mild with anxious distress (HCC)   Other Visit Diagnoses       Chronic obstructive pulmonary disease, unspecified COPD type (HCC)       Relevant Orders   Ambulatory referral to Pulmonology     History of basal cell carcinoma       Relevant Orders   Ambulatory referral to Dermatology     Pruritic rash       Relevant Orders   Ambulatory referral to Dermatology     Vitamin D deficiency         Elevated blood sugar         Arthritis of left hand         History of hepatitis C         Tobacco use disorder       Relevant Orders   Ambulatory referral to Pulmonology      He is here to establish care.  Referral to dermatology per request for skin concerns with hx of basal cell carcinoma  Reviewed notes and results from ED and specialists.  Encourage smoking cessation.  Referral to pulmonology due to COPD on chest X ray and recent COPD flare requiring ED visits. Asymptomatic today. Has albuterol  inhaler.  He is taking a statin and will follow up here in 4 weeks fasting. Coronary artery calcification  seen on CT.  Check labs in 4 wks and follow up on other concerns.   Visit time 25 minutes in face to face communication with patient and coordination of care, additional 10 minutes spent in record review, coordination or care, ordering tests, communicating/referring to other healthcare professionals, documenting in medical records all on the same day of the visit for total time 35 minutes spent on the visit.     Return in about 4 weeks (around 12/05/2023) for Fasting follow up.   Boby Mackintosh, NP-C

## 2023-11-07 NOTE — Patient Instructions (Signed)
 Please call and schedule follow-up visits with your cardiologist and liver specialist  I referred you to [facility] dermatology and [facility] pulmonology.  They will call you to schedule.  Follow-up here fasting (nothing eat or drink except water) in 4 weeks.  Drink 1 to 2 glasses of water before your visit

## 2023-11-12 ENCOUNTER — Encounter: Payer: Self-pay | Admitting: Dermatology

## 2023-11-12 ENCOUNTER — Ambulatory Visit: Admitting: Dermatology

## 2023-11-12 VITALS — HR 77

## 2023-11-12 DIAGNOSIS — L564 Polymorphous light eruption: Secondary | ICD-10-CM | POA: Diagnosis not present

## 2023-11-12 DIAGNOSIS — Z85828 Personal history of other malignant neoplasm of skin: Secondary | ICD-10-CM

## 2023-11-12 MED ORDER — TRIAMCINOLONE ACETONIDE 0.1 % EX CREA
1.0000 | TOPICAL_CREAM | Freq: Every day | CUTANEOUS | 3 refills | Status: AC | PRN
Start: 2023-11-12 — End: ?

## 2023-11-12 NOTE — Patient Instructions (Addendum)
 Date: Tue Nov 12 2023  Hello Dwayne Cortez,  Thank you for visiting today. Here is a summary of the key instructions:  Diagnosis:  Polymorphic Light Eruption (PMLE)  - Medications: Apply triamcinolone  cream to affected areas twice a day for up to 2 weeks   - Stop using if rash clears up sooner   - Do not use for more than 2 weeks without a break  - Skin Care:   - Use mineral sunscreen with zinc oxide or titanium dioxide   - Wear loose-fitting, long-sleeved clothing when outdoors  - Follow-up: Schedule a follow-up appointment in July 2026  Please reach out if you have any questions or concerns.  Warm regards,  Dr. Delon Lenis, Dermatology       Skin Education :   I counseled the patient regarding the following: Sun screen (SPF 30 or greater) should be applied during peak UV exposure (between 10am and 2pm) and reapplied after exercise or swimming.  The ABCDEs of melanoma were reviewed with the patient, and the importance of monthly self-examination of moles was emphasized. Should any moles change in shape or color, or itch, bleed or burn, pt will contact our office for evaluation sooner then their interval appointment.  Plan: Sunscreen Recommendations I recommended a broad spectrum sunscreen with a SPF of 30 or higher. I explained that SPF 30 sunscreens block approximately 97 percent of the sun's harmful rays. Sunscreens should be applied at least 15 minutes prior to expected sun exposure and then every 2 hours after that as long as sun exposure continues. If swimming or exercising sunscreen should be reapplied every 45 minutes to an hour after getting wet or sweating. One ounce, or the equivalent of a shot glass full of sunscreen, is adequate to protect the skin not covered by a bathing suit. I also recommended a lip balm with a sunscreen as well. Sun protective clothing can be used in lieu of sunscreen but must be worn the entire time you are exposed to the sun's rays.  Important  Information  Due to recent changes in healthcare laws, you may see results of your pathology and/or laboratory studies on MyChart before the doctors have had a chance to review them. We understand that in some cases there may be results that are confusing or concerning to you. Please understand that not all results are received at the same time and often the doctors may need to interpret multiple results in order to provide you with the best plan of care or course of treatment. Therefore, we ask that you please give us  2 business days to thoroughly review all your results before contacting the office for clarification. Should we see a critical lab result, you will be contacted sooner.   If You Need Anything After Your Visit  If you have any questions or concerns for your doctor, please call our main line at [phone] If no one answers, please leave a voicemail as directed and we will return your call as soon as possible. Messages left after 4 pm will be answered the following business day.   You may also send us  a message via MyChart. We typically respond to MyChart messages within 1-2 business days.  For prescription refills, please ask your pharmacy to contact our office. Our fax number is [phone].  If you have an urgent issue when the clinic is closed that cannot wait until the next business day, you can page your doctor at the number below.    Please note  that while we do our best to be available for urgent issues outside of office hours, we are not available 24/7.   If you have an urgent issue and are unable to reach us , you may choose to seek medical care at your doctor's office, retail clinic, urgent care center, or emergency room.  If you have a medical emergency, please immediately call 911 or go to the emergency department. In the event of inclement weather, please call our main line at [phone] for an update on the status of any delays or closures.  Dermatology Medication  Tips: Please keep the boxes that topical medications come in in order to help keep track of the instructions about where and how to use these. Pharmacies typically print the medication instructions only on the boxes and not directly on the medication tubes.   If your medication is too expensive, please contact our office at [phone] or send us  a message through MyChart.   We are unable to tell what your co-pay for medications will be in advance as this is different depending on your insurance coverage. However, we may be able to find a substitute medication at lower cost or fill out paperwork to get insurance to cover a needed medication.   If a prior authorization is required to get your medication covered by your insurance company, please allow us  1-2 business days to complete this process.  Drug prices often vary depending on where the prescription is filled and some pharmacies may offer cheaper prices.  The website www.goodrx.com contains coupons for medications through different pharmacies. The prices here do not account for what the cost may be with help from insurance (it may be cheaper with your insurance), but the website can give you the price if you did not use any insurance.  - You can print the associated coupon and take it with your prescription to the pharmacy.  - You may also stop by our office during regular business hours and pick up a GoodRx coupon card.  - If you need your prescription sent electronically to a different pharmacy, notify our office through [facility] or by phone at [phone]

## 2023-11-12 NOTE — Progress Notes (Signed)
   New Patient Visit   Subjective  Dwayne Cortez is a 53 y.o. male who presents for the following: Here concerned about a rash.  Patient states the sun or heat makes it worse. He uses hydrocortisone cream to help with itching. He doesn't have any active spot currently but said he did a week ago.  The following portions of the chart were reviewed this encounter and updated as appropriate: medications, allergies, medical history  Review of Systems:  No other skin or systemic complaints except as noted in HPI or Assessment and Plan.  Objective  Well appearing patient in no apparent distress; mood and affect are within normal limits.   A focused examination was performed of the following areas: Bilateral arms, chest and back.  Relevant exam findings are noted in the Assessment and Plan.    Assessment & Plan     HISTORY OF BASAL CELL CARCINOMA OF THE SKIN - No evidence of recurrence today - Recommend regular full body skin exams - Recommend daily broad spectrum sunscreen SPF 30+ to sun-exposed areas, reapply every 2 hours as needed.  - Call if any new or changing lesions are noted between office visits   1. Polymorphic Light Eruption (PMLE) - Assessment: Patient presents with a recurrent rash associated with sun exposure and heat, occurring for approximately one year. Rash typically appears on chest and neck, sparing the face. It resolves within a couple of days with hydrocortisone application. Rash may also be precipitated by heat alone. Condition appears less severe towards the end of summer, consistent with hardening phenomenon in PMLE. Face likely spared due to consistent year-round sun exposure.  - Plan:    Prescribe triamcinolone  cream for acute treatment     - Apply twice daily for up to 2 weeks     - Discontinue use if rash resolves sooner     - Provide 3 refills    Recommend preventive measures:     - Wear loose-fitting, long-sleeved clothing     - Use mineral  sunscreen containing zinc oxide or titanium dioxide       - Recommended: Excedrin Sensitive Skin sunscreen    Provide patient education on PMLE and the hardening phenomenon      Return for [facility] when he is ready and return in july for rash follow up.  IBerwyn Lesches, Surg Tech III, am acting as scribe for [employer], DO.   Documentation: I have reviewed the above documentation for accuracy and completeness, and I agree with the above.  Delon Lenis, DO

## 2023-11-13 DIAGNOSIS — E782 Mixed hyperlipidemia: Secondary | ICD-10-CM | POA: Diagnosis not present

## 2023-11-13 DIAGNOSIS — I2510 Atherosclerotic heart disease of native coronary artery without angina pectoris: Secondary | ICD-10-CM | POA: Diagnosis not present

## 2023-11-13 DIAGNOSIS — I4510 Unspecified right bundle-branch block: Secondary | ICD-10-CM | POA: Diagnosis not present

## 2023-11-13 DIAGNOSIS — Z720 Tobacco use: Secondary | ICD-10-CM | POA: Diagnosis not present

## 2023-12-06 ENCOUNTER — Ambulatory Visit: Admitting: Family Medicine

## 2023-12-06 ENCOUNTER — Encounter: Payer: Self-pay | Admitting: Family Medicine

## 2023-12-06 NOTE — Progress Notes (Signed)
   Subjective:    Patient ID: Dwayne Cortez, male    DOB: [birth year], 53 y.o.   MRN: [id number]  HPI  Patient seen walking out of the exam room and down the hall and did not return for visit. Did not answer phone call. Did not tell anyone that he was leaving.   Review of Systems     Objective:   Physical Exam        Assessment & Plan:

## 2023-12-24 ENCOUNTER — Ambulatory Visit: Admitting: Urology

## 2023-12-24 ENCOUNTER — Encounter: Payer: Self-pay | Admitting: Urology

## 2023-12-24 VITALS — BP 143/88 | HR 77 | Ht 74.0 in | Wt 170.0 lb

## 2023-12-24 DIAGNOSIS — R351 Nocturia: Secondary | ICD-10-CM | POA: Diagnosis not present

## 2023-12-24 DIAGNOSIS — R3911 Hesitancy of micturition: Secondary | ICD-10-CM

## 2023-12-24 DIAGNOSIS — Z125 Encounter for screening for malignant neoplasm of prostate: Secondary | ICD-10-CM | POA: Diagnosis not present

## 2023-12-24 DIAGNOSIS — R3912 Poor urinary stream: Secondary | ICD-10-CM | POA: Diagnosis not present

## 2023-12-24 DIAGNOSIS — R399 Unspecified symptoms and signs involving the genitourinary system: Secondary | ICD-10-CM

## 2023-12-24 LAB — URINALYSIS, ROUTINE W REFLEX MICROSCOPIC
Bilirubin, UA: NEGATIVE
Glucose, UA: NEGATIVE
Ketones, UA: NEGATIVE
Leukocytes,UA: NEGATIVE
Nitrite, UA: NEGATIVE
Protein,UA: NEGATIVE
RBC, UA: NEGATIVE
Specific Gravity, UA: 1.015 (ref 1.005–1.030)
Urobilinogen, Ur: 0.2 mg/dL (ref 0.2–1.0)
pH, UA: 7.5 (ref 5.0–7.5)

## 2023-12-24 LAB — BLADDER SCAN AMB NON-IMAGING

## 2023-12-24 MED ORDER — ALFUZOSIN HCL ER 10 MG PO TB24: 10.0000 mg | ORAL_TABLET | Freq: Every day | ORAL | 11 refills | Status: AC

## 2023-12-24 NOTE — Progress Notes (Signed)
 Assessment: 1. Lower urinary tract symptoms     Plan: I personally reviewed the patient's chart including provider notes. Trial of alfuzosin  10 mg daily.  Rx sent. PSA today Return to office in 4-6 weeks  Chief Complaint:  Chief Complaint  Patient presents with   Benign Prostatic Hypertrophy    History of Present Illness:  Dwayne Cortez is a 53 y.o. male who is seen in consultation from [place], [place] L, NP-C for evaluation of lower urinary tract symptoms. He reports a 60-month history of increased lower urinary tract symptoms.  He reports hesitancy and decreased stream.  He has nocturia x 2.  No dysuria or gross hematuria.  No history of UTIs or prostatitis. No prior medical therapy for BPH symptoms. IPSS = 19/3.  No recent PSA results.  He also noted a knot on the dorsal penile shaft near the base.  This has gradually decreased in size over the past few months.  No history of trauma during intercourse.  He is able to achieve an erection without difficulty.  No pain or curvature associated with erections.  Past Medical History:  Past Medical History:  Diagnosis Date   Alcohol  abuse    Anxiety    Arthritis    Asthma    Basal cell carcinoma    Closed fracture of orbit with routine healing 10/24/2023   Depression    Diverticulosis 12/07/2022   Emphysema of lung (HCC)    HCV (hepatitis C virus) 11/29/2014   Hemorrhoids 12/07/2022   Hx of adenomatous colonic polyps 12/07/2022   Hypertension    Substance abuse (HCC)     Past Surgical History:  Past Surgical History:  Procedure Laterality Date   KNEE SURGERY      Allergies:  No Known Allergies  Family History:  Family History  Problem Relation Age of Onset   Arthritis Father    Diabetes Father    Kidney disease Father    Stroke Father    ADD / ADHD Brother    Anxiety disorder Brother    Depression Sister    Drug abuse Sister     Social History:  Social History   Tobacco Use   Smoking status:  Every Day    Current packs/day: 1.00    Average packs/day: 1 pack/day for 35.0 years (35.0 ttl pk-yrs)    Types: Cigarettes   Smokeless tobacco: Never  Substance Use Topics   Alcohol  use: Not Currently    Comment: occ   Drug use: Not Currently    Types: Cocaine    Comment: hx of cocaine and heroin    Review of symptoms:  Constitutional:  Negative for unexplained weight loss, night sweats, fever, chills ENT:  Negative for nose bleeds, sinus pain, painful swallowing CV:  Negative for chest pain, shortness of breath, exercise intolerance, palpitations, loss of consciousness Resp:  Negative for cough, wheezing, shortness of breath GI:  Negative for nausea, vomiting, diarrhea, bloody stools GU:  Positives noted in HPI; otherwise negative for gross hematuria, dysuria, urinary incontinence Neuro:  Negative for seizures, poor balance, limb weakness, slurred speech Psych:  Negative for lack of energy, depression, anxiety Endocrine:  Negative for polydipsia, polyuria, symptoms of hypoglycemia (dizziness, hunger, sweating) Hematologic:  Negative for anemia, purpura, petechia, prolonged or excessive bleeding, use of anticoagulants  Allergic:  Negative for difficulty breathing or choking as a result of exposure to anything; no shellfish allergy; no allergic response (rash/itch) to materials, foods  Physical exam: BP (!) 143/88  Pulse 77   Ht 6' 2 (1.88 m)   Wt 170 lb (77.1 kg)   BMI 21.83 kg/m  GENERAL APPEARANCE:  Well appearing, well developed, well nourished, NAD HEENT: Atraumatic, Normocephalic, oropharynx clear. NECK: Supple without lymphadenopathy or thyromegaly. LUNGS: Clear to auscultation bilaterally. HEART: Regular Rate and Rhythm without murmurs, gallops, or rubs. ABDOMEN: Soft, non-tender, No Masses. EXTREMITIES: Moves all extremities well.  Without clubbing, cyanosis, or edema. NEUROLOGIC:  Alert and oriented x 3, normal gait, CN II-XII grossly intact.  MENTAL STATUS:   Appropriate. BACK:  Non-tender to palpation.  No CVAT SKIN:  Warm, dry and intact.   GU: Penis:  circumcised; ? Small plaque palpated at base of dorsal shaft, NT Meatus: Normal Scrotum: normal, no masses Testis: normal without masses bilateral Prostate: 40 g, NT, no nodules Rectum: Normal tone,  no masses or tenderness   Results: U/A: negative  PVR = 34 ml

## 2023-12-25 ENCOUNTER — Ambulatory Visit: Payer: Self-pay | Admitting: Urology

## 2023-12-25 LAB — PSA: Prostate Specific Ag, Serum: 0.3 ng/mL (ref 0.0–4.0)

## 2024-01-08 ENCOUNTER — Telehealth: Payer: Self-pay

## 2024-01-08 ENCOUNTER — Ambulatory Visit: Admitting: Pulmonary Disease

## 2024-01-08 NOTE — Telephone Encounter (Signed)
 Spoke w/ pt to check to see what he needed to be seen for Per JD. Pt states COPD

## 2024-01-10 ENCOUNTER — Telehealth (INDEPENDENT_AMBULATORY_CARE_PROVIDER_SITE_OTHER): Payer: Self-pay | Admitting: Family

## 2024-01-10 DIAGNOSIS — F331 Major depressive disorder, recurrent, moderate: Secondary | ICD-10-CM

## 2024-01-10 DIAGNOSIS — F411 Generalized anxiety disorder: Secondary | ICD-10-CM

## 2024-01-10 MED ORDER — FLUOXETINE HCL 20 MG PO CAPS: 60.0000 mg | ORAL_CAPSULE | Freq: Every day | ORAL | 3 refills | Status: DC

## 2024-01-10 MED ORDER — GABAPENTIN 400 MG PO CAPS: ORAL_CAPSULE | ORAL | 3 refills | Status: DC

## 2024-01-10 NOTE — Progress Notes (Signed)
 [facility] MD/PA/NP OP Progress Note Virtual Visit via Video Note  I connected with Dwayne Cortez on 01/10/24 at 11:00 AM EDT by a video enabled telemedicine application and verified that I am speaking with the correct person using two identifiers.  Location: Patient: Home Provider: Clinic   I discussed the limitations of evaluation and management by telemedicine and the availability of in person appointments. The patient expressed understanding and agreed to proceed.  I provided 30 minutes of non-face-to-face time during this encounter.   01/10/2024 11:26 AM Dwayne Cortez  MRN:  [id number]  Chief Complaint: I have hand pain and issues with my penis  HPI: 53 year old male seen today for follow up psychiatric evaluation. 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  He is currently managed on Prozac  60 mg daily, hydroxyzine  10 mg 3 times daily as needed, and gabapentin  300 mg three daily.  He informed Clinical research associate that he has been taking gabapentin  4 times daily.  He notes that his mediations are effective in managing his psychiatric conditions.    The patient presents for outpatient psychiatric follow-up and states he is doing "pretty good." He reports that his mood has been stable overall. He shares that his last drink was on 10/09/2023 and denies any current withdrawal symptoms, cravings, or urges to drink. He endorses taking all prescribed psychotropic medications along with daily vitamin D. He reports good sleep, stable appetite, and has gained approximately 3-5 pounds over the past month. He denies suicidal ideation, homicidal ideation, auditory or visual hallucinations, and manic symptoms.  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 He acknowledges occasional forgetfulness with medications, often reminded by his girlfriend, but overall demonstrates good compliance and reports benefit from his current regimen. He denies any substance or alcohol  use at this time.  Objective: The patient is well-groomed, calm, and cooperative throughout the visit. He engages appropriately and communicates in a clear, coherent, and goal-directed manner. Mood appears euthymic with congruent affect. There is no evidence of psychomotor agitation or retardation. Thought process is logical and organized, with no signs of delusions or perceptual disturbances. Insight and judgment are assessed as fair. No signs of acute alcohol  withdrawal are observed during the visit. He reports medication adherence and denies any adverse effects from current medications.    Visit Diagnosis:    ICD-10-CM   1. Major depressive disorder, recurrent episode, moderate (HCC)  F33.1     2. 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), substance-induced mood disorder, tobacco dependence, ETOH abuse ( in remission), and social phobia  Past Medical History:  Past Medical History:  Diagnosis Date   Alcohol  abuse    Anxiety    Arthritis    Asthma    Basal cell carcinoma    Closed fracture of orbit with routine healing 10/24/2023   Depression    Diverticulosis 12/07/2022   Emphysema of lung (HCC)    HCV (hepatitis C virus) 11/29/2014   Hemorrhoids 12/07/2022   Hx of adenomatous colonic polyps 12/07/2022   Hypertension    Substance abuse (HCC)     Past Surgical History:  Procedure Laterality Date   KNEE SURGERY  Family Psychiatric History: Denies  Family History:  Family History  Problem Relation Age of Onset   Arthritis Father    Diabetes Father    Kidney disease Father    Stroke Father    ADD / ADHD Brother    Anxiety disorder Brother     Depression Sister    Drug abuse Sister     Social History:  Social History   Socioeconomic History   Marital status: Single    Spouse name: Not on file   Number of children: Not on file   Years of education: Not on file   Highest education level: Associate degree: academic program  Occupational History   Not on file  Tobacco Use   Smoking status: Every Day    Current packs/day: 1.00    Average packs/day: 1 pack/day for 35.0 years (35.0 ttl pk-yrs)    Types: Cigarettes   Smokeless tobacco: Never  Substance and Sexual Activity   Alcohol  use: Not Currently    Comment: occ   Drug use: Not Currently    Types: Cocaine    Comment: hx of cocaine and heroin   Sexual activity: Not Currently    Birth control/protection: None  Other Topics Concern   Not on file  Social History Narrative   Not on file   Social Drivers of Health   Financial Resource Strain: Medium Risk (11/06/2023)   Overall Financial Resource Strain (CARDIA)    Difficulty of Paying Living Expenses: Somewhat hard  Food Insecurity: Food Insecurity Present (11/06/2023)   Hunger Vital Sign    Worried About Running Out of Food in the Last Year: Sometimes true    Ran Out of Food in the Last Year: Sometimes true  Transportation Needs: No Transportation Needs (11/06/2023)   PRAPARE - Administrator, Civil Service (Medical): No    Lack of Transportation (Non-Medical): No  Physical Activity: Unknown (11/06/2023)   Exercise Vital Sign    Days of Exercise per Week: Patient declined    Minutes of Exercise per Session: Not on file  Stress: Stress Concern Present (11/06/2023)   [employer] of Occupational Health - Occupational Stress Questionnaire    Feeling of Stress: Rather much  Social Connections: Socially Isolated (11/06/2023)   Social Connection and Isolation Panel    Frequency of Communication with Friends and Family: More than three times a week    Frequency of Social Gatherings with Friends and Family:  Once a week    Attends Religious Services: Never    Database administrator or Organizations: No    Attends Engineer, structural: Not on file    Marital Status: Never married    Allergies: No Known Allergies  Metabolic Disorder Labs: No results found for: HGBA1C, MPG No results found for: PROLACTIN No results found for: CHOL, TRIG, HDL, CHOLHDL, VLDL, LDLCALC No results found for: TSH  Therapeutic Level Labs: No results found for: LITHIUM No results found for: VALPROATE Lab Results  Component Value Date   CBMZ 7.8 10/17/2011    Current Medications: Current Outpatient Medications  Medication Sig Dispense Refill   alfuzosin  (UROXATRAL ) 10 MG 24 hr tablet Take 1 tablet (10 mg total) by mouth daily. 30 tablet 11   FLUoxetine  (PROZAC ) 20 MG capsule Take 3 capsules (60 mg total) by mouth daily. 90 capsule 3   gabapentin  (NEURONTIN ) 400 MG capsule TAKE 1 CAPSULE(300 MG) BY MOUTH THREE TIMES DAILY 400 capsule 3   hydrOXYzine  (ATARAX ) 10 MG tablet Take  1 tablet (10 mg total) by mouth 3 (three) times daily as needed. 90 tablet 3   rosuvastatin (CRESTOR) 20 MG tablet Take 20 mg by mouth at bedtime.     triamcinolone  cream (KENALOG ) 0.1 % Apply 1 Application topically daily as needed. 453 g 3   No current facility-administered medications for this visit.     Musculoskeletal: Strength & Muscle Tone: within normal limits and telehealth visit Gait & Station: normal, telehealth visit Patient leans: N/A  Psychiatric Specialty Exam: Review of Systems  All other systems reviewed and are negative.   There were no vitals taken for this visit.There is no height or weight on file to calculate BMI.  General Appearance: Well Groomed  Eye Contact:  Good  Speech:  Clear and Coherent and Normal Rate  Volume:  Normal  Mood:  Euthymic  Affect:  Appropriate and Congruent  Thought Process:  Coherent, Goal Directed and Linear  Orientation:  Full (Time, Place, and  Person)  Thought Content: WDL and Logical   Suicidal Thoughts:  No  Homicidal Thoughts:  No  Memory:  Immediate;   Good Recent;   Good Remote;   Good  Judgement:  Good  Insight:  Good  Psychomotor Activity:  Normal  Concentration:  Concentration: Good and Attention Span: Good  Recall:  Good  Fund of Knowledge: Good  Language: Good  Akathisia:  No  Handed:  Right  AIMS (if indicated): Not done  Assets:  Communication Skills Desire for Improvement Financial Resources/Insurance Housing Intimacy Social Support  ADL's:  Intact  Cognition: WNL  Sleep:  Good   Screenings: AUDIT    Garment/textile technologist Visit from 11/07/2023 in [facility] [employer]  Alcohol  Use Disorder Identification Test Final Score (AUDIT) 22    GAD-7    Flowsheet Row Video Visit from 07/17/2023 in [facility] Video Visit from 05/14/2023 in [facility] Video Visit from 11/21/2022 in [facility] Video Visit from 09/06/2022 in [facility] Video Visit from 10/25/2021 in [facility]  Total GAD-7 Score 7 2 2 13 4    PHQ2-9    Flowsheet Row Office Visit from 12/06/2023 in [facility] [place] Office Visit from 11/07/2023 in [facility] Video Visit from 07/17/2023 in [facility] Video Visit from 05/14/2023 in [facility] Video Visit from 11/21/2022 in [facility]  PHQ-2 Total Score 0 0 1 0 0  PHQ-9 Total Score 0 -- 4 2 2    Flowsheet Row ED from 10/18/2023 in [facility] Department at [facility] ED from 02/17/2023 in [facility] at [facility] UC from 10/31/2021 in [facility])  C-SSRS RISK CATEGORY No Risk No Risk No Risk      Assessment and Plan: Patient reports compliance with his medications. He notes that it has been effective in managing his anxiety and mood.  No other medication changes made.   Patient will continue medications as prescribed    1. Major depressive disorder, recurrent episode, moderate (HCC)  Continue - FLUoxetine  (PROZAC ) 20 MG capsule; Take 3 capsules (60 mg total) by mouth daily.  Dispense: 90 capsule; Refill: 3  2. Generalized anxiety disorder  Increased- gabapentin  (NEURONTIN ) 400 MG capsule; TAKE 1 CAPSULE(300 MG) BY MOUTH THREE TIMES DAILY  Dispense:  400 capsule; Refill: 3 Continue- hydrOXYzine  (ATARAX ) 10 MG tablet; Take 1 tablet (10 mg total) by mouth 3 (three) times daily as needed.  Dispense: 90 tablet; Refill: 3   Follow-up in 3 months Follow-up with therapy  Majel GORMAN Ramp, FNP 01/10/2024, 11:26 AM

## 2024-01-22 ENCOUNTER — Ambulatory Visit (INDEPENDENT_AMBULATORY_CARE_PROVIDER_SITE_OTHER): Payer: MEDICAID | Admitting: Dermatology

## 2024-01-22 ENCOUNTER — Encounter: Payer: Self-pay | Admitting: Dermatology

## 2024-01-22 VITALS — BP 129/81

## 2024-01-22 DIAGNOSIS — X32XXXA Exposure to sunlight, initial encounter: Secondary | ICD-10-CM

## 2024-01-22 DIAGNOSIS — W908XXA Exposure to other nonionizing radiation, initial encounter: Secondary | ICD-10-CM | POA: Diagnosis not present

## 2024-01-22 DIAGNOSIS — L814 Other melanin hyperpigmentation: Secondary | ICD-10-CM | POA: Diagnosis not present

## 2024-01-22 DIAGNOSIS — L578 Other skin changes due to chronic exposure to nonionizing radiation: Secondary | ICD-10-CM

## 2024-01-22 DIAGNOSIS — D1801 Hemangioma of skin and subcutaneous tissue: Secondary | ICD-10-CM

## 2024-01-22 DIAGNOSIS — Z1283 Encounter for screening for malignant neoplasm of skin: Secondary | ICD-10-CM

## 2024-01-22 DIAGNOSIS — L570 Actinic keratosis: Secondary | ICD-10-CM

## 2024-01-22 DIAGNOSIS — L564 Polymorphous light eruption: Secondary | ICD-10-CM

## 2024-01-22 DIAGNOSIS — L821 Other seborrheic keratosis: Secondary | ICD-10-CM | POA: Diagnosis not present

## 2024-01-22 DIAGNOSIS — D229 Melanocytic nevi, unspecified: Secondary | ICD-10-CM

## 2024-01-22 DIAGNOSIS — Z85828 Personal history of other malignant neoplasm of skin: Secondary | ICD-10-CM

## 2024-01-22 NOTE — Progress Notes (Signed)
   Total Body Skin Exam (TBSE) Visit   Subjective  Dwayne Cortez is a 53 y.o. male ESTABLISHED PATIENT who presents for the following:  Total Body Skin Exam (TBSE)  Patient does have spots of concern to be evaluated. She does apply sunscreen and/or wears protective coverings. Reports Hx of Bx - BCC. Family Hx of skin cancers (father - melanoma).   The patient has spots, moles and lesions to be evaluated, some may be new or changing and the patient has concerns that these could be cancer.  The following portions of the chart were reviewed this encounter and updated as appropriate: medications, allergies, medical history  Review of Systems:  No other skin or systemic complaints except as noted in HPI or Assessment and Plan.  Objective  Well appearing patient in no apparent distress; mood and affect are within normal limits.  A full examination was performed including scalp, head, eyes, ears, nose, lips, neck, chest, axillae, abdomen, back, buttocks, bilateral upper extremities, bilateral lower extremities, hands, feet, fingers, toes, fingernails, and toenails. All findings within normal limits unless otherwise noted below.   Relevant physical exam findings are noted in the Assessment and Plan.    Assessment & Plan   HISTORY OF BASAL CELL CARCINOMA OF THE SKIN - well healed scar - No evidence of recurrence today - Recommend regular full body skin exams - Recommend daily broad spectrum sunscreen SPF 30+ to sun-exposed areas, reapply every 2 hours as needed.  - Call if any new or changing lesions are noted between office visits   LENTIGINES, SEBORRHEIC KERATOSES, HEMANGIOMAS - Benign normal skin lesions - Benign-appearing - Call for any changes  BENIGN MELANOCYTIC NEVI - Tan-brown and/or pink-flesh-colored symmetric macules and papules - Benign appearing on exam today - Observation - Call clinic for new or changing moles - Recommend daily use of broad spectrum spf 30+  sunscreen to sun-exposed areas.   MILD ACTINIC DAMAGE - Chronic condition, secondary to cumulative UV/sun exposure - diffuse scaly erythematous macules with underlying dyspigmentation - Recommend daily broad spectrum sunscreen SPF 30+ to sun-exposed areas, reapply every 2 hours as needed.  - Staying in the shade or wearing long sleeves, sun glasses (UVA+UVB protection) and wide brim hats (4-inch brim around the entire circumference of the hat) are also recommended for sun protection.  - Call for new or changing lesions.  Polymorphic Light Eruption (PMLE)  Exam: flared on dorsal B/L hands on exam today  Treatment Plan: - Restart TMC - BID for up to 2 weeks.    SKIN CANCER SCREENING PERFORMED TODAY.    Return in about 1 year (around 01/21/2025) for TBSE.   Documentation: I have reviewed the above documentation for accuracy and completeness, and I agree with the above.  I, Shirron Maranda, CMA, am acting as scribe for [employer], DO.   Delon Lenis, DO

## 2024-01-22 NOTE — Patient Instructions (Addendum)

## 2024-02-04 ENCOUNTER — Ambulatory Visit: Admitting: Urology

## 2024-02-04 NOTE — Progress Notes (Deleted)
   Assessment: 1. Lower urinary tract symptoms     Plan: Continue alfuzosin  10 mg daily.  Return to office in 4-6 weeks  Chief Complaint:  No chief complaint on file.   History of Present Illness:  Dwayne Cortez is a 53 y.o. male who is seen for further evaluation of lower urinary tract symptoms. At his initial visit in August 2025, he reported a 39-month history of increased lower urinary tract symptoms.  He reported hesitancy, decreased stream and nocturia x 2.  No dysuria or gross hematuria.  No history of UTIs or prostatitis. No prior medical therapy for BPH symptoms. IPSS = 19/3.  PSA 8/25:  0.3  He was started on alfuzosin  10 mg daily for his lower urinary tract symptoms.  He also noted a knot on the dorsal penile shaft near the base.  This has gradually decreased in size over the past few months.  No history of trauma during intercourse.  He is able to achieve an erection without difficulty.  No pain or curvature associated with erections.  Portions of the above documentation were copied from a prior visit for review purposes only.    Past Medical History:  Past Medical History:  Diagnosis Date   Alcohol  abuse    Anxiety    Arthritis    Asthma    Basal cell carcinoma    Closed fracture of orbit with routine healing 10/24/2023   Depression    Diverticulosis 12/07/2022   Emphysema of lung (HCC)    HCV (hepatitis C virus) 11/29/2014   Hemorrhoids 12/07/2022   Hx of adenomatous colonic polyps 12/07/2022   Hypertension    Substance abuse (HCC)     Past Surgical History:  Past Surgical History:  Procedure Laterality Date   KNEE SURGERY      Allergies:  No Known Allergies  Family History:  Family History  Problem Relation Age of Onset   Melanoma Father    Arthritis Father    Diabetes Father    Kidney disease Father    Stroke Father    Depression Sister    Drug abuse Sister    ADD / ADHD Brother    Anxiety disorder Brother     Social History:   Social History   Tobacco Use   Smoking status: Every Day    Current packs/day: 1.00    Average packs/day: 1 pack/day for 35.0 years (35.0 ttl pk-yrs)    Types: Cigarettes   Smokeless tobacco: Never  Substance Use Topics   Alcohol  use: Not Currently    Comment: occ   Drug use: Not Currently    Types: Cocaine    Comment: hx of cocaine and heroin    ROS: Constitutional:  Negative for fever, chills, weight loss CV: Negative for chest pain, previous MI, hypertension Respiratory:  Negative for shortness of breath, wheezing, sleep apnea, frequent cough GI:  Negative for nausea, vomiting, bloody stool, GERD  Physical exam: There were no vitals taken for this visit. GENERAL APPEARANCE:  Well appearing, well developed, well nourished, NAD HEENT:  Atraumatic, normocephalic, oropharynx clear NECK:  Supple without lymphadenopathy or thyromegaly ABDOMEN:  Soft, non-tender, no masses EXTREMITIES:  Moves all extremities well, without clubbing, cyanosis, or edema NEUROLOGIC:  Alert and oriented x 3, normal gait, CN II-XII grossly intact MENTAL STATUS:  appropriate BACK:  Non-tender to palpation, No CVAT SKIN:  Warm, dry, and intact   Results: U/A:

## 2024-02-20 ENCOUNTER — Other Ambulatory Visit (HOSPITAL_COMMUNITY): Payer: Self-pay | Admitting: Psychiatry

## 2024-02-20 ENCOUNTER — Telehealth (HOSPITAL_COMMUNITY): Payer: Self-pay | Admitting: *Deleted

## 2024-02-20 DIAGNOSIS — F411 Generalized anxiety disorder: Secondary | ICD-10-CM

## 2024-02-20 DIAGNOSIS — F331 Major depressive disorder, recurrent, moderate: Secondary | ICD-10-CM

## 2024-02-20 DIAGNOSIS — F330 Major depressive disorder, recurrent, mild: Secondary | ICD-10-CM

## 2024-02-20 MED ORDER — HYDROXYZINE HCL 10 MG PO TABS: 10.0000 mg | ORAL_TABLET | Freq: Three times a day (TID) | ORAL | 3 refills | Status: AC | PRN

## 2024-02-20 MED ORDER — GABAPENTIN 400 MG PO CAPS
ORAL_CAPSULE | ORAL | 3 refills | Status: AC
Start: 2024-02-20 — End: ?

## 2024-02-20 MED ORDER — FLUOXETINE HCL 20 MG PO CAPS: 60.0000 mg | ORAL_CAPSULE | Freq: Every day | ORAL | 3 refills | Status: AC

## 2024-02-20 NOTE — Telephone Encounter (Signed)
 Pt called [facility] office requesting refills and stating that the pharmacy said that scripts were denied and will not fill. Last scheduled appointment was a No Show.   Last visit: 07/17/23 Next visit: None

## 2024-02-20 NOTE — Telephone Encounter (Signed)
 Medication sent to preferred pharmacy

## 2024-11-12 ENCOUNTER — Ambulatory Visit: Admitting: Dermatology
